# Patient Record
Sex: Female | Born: 1970 | Race: White | Hispanic: No | Marital: Married | Smoking: Current every day smoker
Health system: Southern US, Community
[De-identification: ages and names within clinical notes are randomized; demographics above are authoritative.]

## PROBLEM LIST (undated history)

## (undated) DIAGNOSIS — F419 Anxiety disorder, unspecified: Secondary | ICD-10-CM

## (undated) DIAGNOSIS — F329 Major depressive disorder, single episode, unspecified: Secondary | ICD-10-CM

## (undated) DIAGNOSIS — F988 Other specified behavioral and emotional disorders with onset usually occurring in childhood and adolescence: Secondary | ICD-10-CM

## (undated) DIAGNOSIS — M1990 Unspecified osteoarthritis, unspecified site: Secondary | ICD-10-CM

## (undated) DIAGNOSIS — I10 Essential (primary) hypertension: Secondary | ICD-10-CM

## (undated) DIAGNOSIS — R7303 Prediabetes: Secondary | ICD-10-CM

## (undated) DIAGNOSIS — F32A Depression, unspecified: Secondary | ICD-10-CM

## (undated) HISTORY — DX: Unspecified osteoarthritis, unspecified site: M19.90

---

## 2001-04-20 ENCOUNTER — Emergency Department (HOSPITAL_COMMUNITY): Admission: EM | Admit: 2001-04-20 | Discharge: 2001-04-21 | Payer: Self-pay | Admitting: *Deleted

## 2001-07-20 ENCOUNTER — Emergency Department (HOSPITAL_COMMUNITY): Admission: EM | Admit: 2001-07-20 | Discharge: 2001-07-20 | Payer: Self-pay | Admitting: Internal Medicine

## 2001-08-13 ENCOUNTER — Emergency Department (HOSPITAL_COMMUNITY): Admission: EM | Admit: 2001-08-13 | Discharge: 2001-08-13 | Payer: Self-pay | Admitting: Emergency Medicine

## 2001-11-02 ENCOUNTER — Emergency Department (HOSPITAL_COMMUNITY): Admission: EM | Admit: 2001-11-02 | Discharge: 2001-11-02 | Payer: Self-pay | Admitting: Emergency Medicine

## 2002-01-09 ENCOUNTER — Encounter: Payer: Self-pay | Admitting: Family Medicine

## 2002-01-09 ENCOUNTER — Ambulatory Visit (HOSPITAL_COMMUNITY): Admission: RE | Admit: 2002-01-09 | Discharge: 2002-01-09 | Payer: Self-pay | Admitting: Family Medicine

## 2002-06-14 ENCOUNTER — Emergency Department (HOSPITAL_COMMUNITY): Admission: EM | Admit: 2002-06-14 | Discharge: 2002-06-14 | Payer: Self-pay | Admitting: *Deleted

## 2002-06-28 ENCOUNTER — Emergency Department (HOSPITAL_COMMUNITY): Admission: EM | Admit: 2002-06-28 | Discharge: 2002-06-28 | Payer: Self-pay | Admitting: Emergency Medicine

## 2002-08-03 ENCOUNTER — Inpatient Hospital Stay (HOSPITAL_COMMUNITY): Admission: EM | Admit: 2002-08-03 | Discharge: 2002-08-07 | Payer: Self-pay | Admitting: Psychiatry

## 2002-08-03 ENCOUNTER — Emergency Department (HOSPITAL_COMMUNITY): Admission: EM | Admit: 2002-08-03 | Discharge: 2002-08-03 | Payer: Self-pay | Admitting: Emergency Medicine

## 2002-08-30 ENCOUNTER — Emergency Department (HOSPITAL_COMMUNITY): Admission: EM | Admit: 2002-08-30 | Discharge: 2002-08-30 | Payer: Self-pay | Admitting: Emergency Medicine

## 2002-08-30 ENCOUNTER — Encounter: Payer: Self-pay | Admitting: Emergency Medicine

## 2002-09-11 ENCOUNTER — Emergency Department (HOSPITAL_COMMUNITY): Admission: EM | Admit: 2002-09-11 | Discharge: 2002-09-11 | Payer: Self-pay | Admitting: Emergency Medicine

## 2002-10-06 ENCOUNTER — Emergency Department (HOSPITAL_COMMUNITY): Admission: EM | Admit: 2002-10-06 | Discharge: 2002-10-06 | Payer: Self-pay | Admitting: *Deleted

## 2002-10-06 ENCOUNTER — Encounter: Payer: Self-pay | Admitting: *Deleted

## 2003-03-27 ENCOUNTER — Emergency Department (HOSPITAL_COMMUNITY): Admission: EM | Admit: 2003-03-27 | Discharge: 2003-03-27 | Payer: Self-pay | Admitting: Emergency Medicine

## 2003-04-30 ENCOUNTER — Emergency Department (HOSPITAL_COMMUNITY): Admission: EM | Admit: 2003-04-30 | Discharge: 2003-04-30 | Payer: Self-pay | Admitting: Emergency Medicine

## 2003-12-12 ENCOUNTER — Emergency Department (HOSPITAL_COMMUNITY): Admission: EM | Admit: 2003-12-12 | Discharge: 2003-12-12 | Payer: Self-pay | Admitting: Emergency Medicine

## 2004-01-22 ENCOUNTER — Emergency Department (HOSPITAL_COMMUNITY): Admission: EM | Admit: 2004-01-22 | Discharge: 2004-01-22 | Payer: Self-pay | Admitting: Emergency Medicine

## 2004-04-24 ENCOUNTER — Emergency Department (HOSPITAL_COMMUNITY): Admission: EM | Admit: 2004-04-24 | Discharge: 2004-04-24 | Payer: Self-pay | Admitting: Emergency Medicine

## 2004-06-18 ENCOUNTER — Emergency Department (HOSPITAL_COMMUNITY): Admission: EM | Admit: 2004-06-18 | Discharge: 2004-06-18 | Payer: Self-pay | Admitting: Emergency Medicine

## 2004-07-08 ENCOUNTER — Emergency Department (HOSPITAL_COMMUNITY): Admission: EM | Admit: 2004-07-08 | Discharge: 2004-07-08 | Payer: Self-pay | Admitting: Emergency Medicine

## 2004-07-10 ENCOUNTER — Emergency Department (HOSPITAL_COMMUNITY): Admission: EM | Admit: 2004-07-10 | Discharge: 2004-07-10 | Payer: Self-pay | Admitting: Emergency Medicine

## 2004-09-11 ENCOUNTER — Emergency Department (HOSPITAL_COMMUNITY): Admission: EM | Admit: 2004-09-11 | Discharge: 2004-09-11 | Payer: Self-pay | Admitting: Emergency Medicine

## 2004-09-12 ENCOUNTER — Emergency Department (HOSPITAL_COMMUNITY): Admission: EM | Admit: 2004-09-12 | Discharge: 2004-09-12 | Payer: Self-pay | Admitting: Emergency Medicine

## 2004-10-19 ENCOUNTER — Emergency Department (HOSPITAL_COMMUNITY): Admission: EM | Admit: 2004-10-19 | Discharge: 2004-10-19 | Payer: Self-pay | Admitting: Emergency Medicine

## 2004-11-13 ENCOUNTER — Emergency Department (HOSPITAL_COMMUNITY): Admission: EM | Admit: 2004-11-13 | Discharge: 2004-11-13 | Payer: Self-pay | Admitting: Emergency Medicine

## 2005-04-19 ENCOUNTER — Emergency Department (HOSPITAL_COMMUNITY): Admission: EM | Admit: 2005-04-19 | Discharge: 2005-04-19 | Payer: Self-pay | Admitting: Emergency Medicine

## 2005-06-06 ENCOUNTER — Emergency Department (HOSPITAL_COMMUNITY): Admission: EM | Admit: 2005-06-06 | Discharge: 2005-06-06 | Payer: Self-pay | Admitting: Emergency Medicine

## 2005-06-20 ENCOUNTER — Emergency Department (HOSPITAL_COMMUNITY): Admission: EM | Admit: 2005-06-20 | Discharge: 2005-06-20 | Payer: Self-pay | Admitting: Emergency Medicine

## 2005-07-08 ENCOUNTER — Emergency Department (HOSPITAL_COMMUNITY): Admission: EM | Admit: 2005-07-08 | Discharge: 2005-07-08 | Payer: Self-pay | Admitting: Emergency Medicine

## 2005-07-22 ENCOUNTER — Emergency Department (HOSPITAL_COMMUNITY): Admission: EM | Admit: 2005-07-22 | Discharge: 2005-07-22 | Payer: Self-pay | Admitting: Emergency Medicine

## 2005-08-26 ENCOUNTER — Emergency Department (HOSPITAL_COMMUNITY): Admission: EM | Admit: 2005-08-26 | Discharge: 2005-08-26 | Payer: Self-pay | Admitting: Emergency Medicine

## 2005-09-16 ENCOUNTER — Emergency Department (HOSPITAL_COMMUNITY): Admission: EM | Admit: 2005-09-16 | Discharge: 2005-09-16 | Payer: Self-pay | Admitting: Emergency Medicine

## 2005-10-08 ENCOUNTER — Emergency Department (HOSPITAL_COMMUNITY): Admission: EM | Admit: 2005-10-08 | Discharge: 2005-10-08 | Payer: Self-pay | Admitting: Emergency Medicine

## 2005-12-30 ENCOUNTER — Emergency Department (HOSPITAL_COMMUNITY): Admission: EM | Admit: 2005-12-30 | Discharge: 2005-12-30 | Payer: Self-pay | Admitting: Emergency Medicine

## 2006-03-28 ENCOUNTER — Emergency Department (HOSPITAL_COMMUNITY): Admission: EM | Admit: 2006-03-28 | Discharge: 2006-03-28 | Payer: Self-pay | Admitting: Emergency Medicine

## 2007-07-03 ENCOUNTER — Emergency Department (HOSPITAL_COMMUNITY): Admission: EM | Admit: 2007-07-03 | Discharge: 2007-07-03 | Payer: Self-pay | Admitting: Emergency Medicine

## 2009-09-17 ENCOUNTER — Emergency Department (HOSPITAL_COMMUNITY): Admission: EM | Admit: 2009-09-17 | Discharge: 2009-09-17 | Payer: Self-pay | Admitting: Emergency Medicine

## 2010-04-17 ENCOUNTER — Emergency Department (HOSPITAL_COMMUNITY): Admission: EM | Admit: 2010-04-17 | Discharge: 2010-04-18 | Payer: Self-pay | Admitting: Emergency Medicine

## 2010-09-10 ENCOUNTER — Encounter: Payer: Self-pay | Admitting: Emergency Medicine

## 2010-11-02 LAB — BASIC METABOLIC PANEL
BUN: 7 mg/dL (ref 6–23)
CO2: 24 mEq/L (ref 19–32)
Chloride: 107 mEq/L (ref 96–112)
GFR calc Af Amer: >60 mL/min (ref >60)
GFR calc non Af Amer: >60 mL/min (ref >60)
Sodium: 140 mEq/L (ref 135–145)

## 2010-11-02 LAB — RAPID URINE DRUG SCREEN, HOSP PERFORMED
Amphetamines: NOT DETECTED
Benzodiazepines: POSITIVE — AB

## 2010-11-02 LAB — URINALYSIS, ROUTINE W REFLEX MICROSCOPIC
Bilirubin Urine: NEGATIVE
Glucose, UA: NEGATIVE mg/dL
Leukocytes, UA: NEGATIVE
Nitrite: NEGATIVE
pH: 5.5 (ref 5.0–8.0)

## 2010-11-02 LAB — CBC
HCT: 49 % — ABNORMAL HIGH (ref 36.0–46.0)
Hemoglobin: 16.1 g/dL — ABNORMAL HIGH (ref 12.0–15.0)
Platelets: 406 10*3/uL — ABNORMAL HIGH (ref 150–400)
RDW: 13 % (ref 11.5–15.5)

## 2010-11-02 LAB — ETHANOL: Alcohol, Ethyl (B): <5 mg/dL (ref 0–10)

## 2010-11-02 LAB — DIFFERENTIAL
Lymphs Abs: 4.5 10*3/uL — ABNORMAL HIGH (ref 0.7–4.0)
Monocytes Relative: 3 % (ref 3–12)
Neutrophils Relative %: 68 % (ref 43–77)

## 2010-11-02 LAB — URINE MICROSCOPIC-ADD ON

## 2010-11-05 LAB — BASIC METABOLIC PANEL WITH GFR
BUN: 9 mg/dL (ref 6–23)
CO2: 26 meq/L (ref 19–32)
Calcium: 9.9 mg/dL (ref 8.4–10.5)
Chloride: 102 meq/L (ref 96–112)
Creatinine, Ser: 0.59 mg/dL (ref 0.4–1.2)
GFR calc non Af Amer: >60 mL/min
Glucose, Bld: 109 mg/dL — ABNORMAL HIGH (ref 70–99)
Potassium: 3.2 meq/L — ABNORMAL LOW (ref 3.5–5.1)
Sodium: 136 meq/L (ref 135–145)

## 2010-11-05 LAB — RAPID URINE DRUG SCREEN, HOSP PERFORMED
Amphetamines: NOT DETECTED
Barbiturates: NOT DETECTED
Cocaine: NOT DETECTED
Opiates: NOT DETECTED
Tetrahydrocannabinol: NOT DETECTED

## 2010-11-05 LAB — CBC
HCT: 45.4 % (ref 36.0–46.0)
Hemoglobin: 15.4 g/dL — ABNORMAL HIGH (ref 12.0–15.0)
MCHC: 33.9 g/dL (ref 30.0–36.0)
MCV: 91.4 fL (ref 78.0–100.0)
Platelets: 405 10*3/uL — ABNORMAL HIGH (ref 150–400)
RBC: 4.97 MIL/uL (ref 3.87–5.11)
RDW: 13.3 % (ref 11.5–15.5)
WBC: 13.4 10*3/uL — ABNORMAL HIGH (ref 4.0–10.5)

## 2010-11-05 LAB — DIFFERENTIAL
Basophils Absolute: 0 10*3/uL (ref 0.0–0.1)
Basophils Relative: 0 % (ref 0–1)
Eosinophils Absolute: 0 10*3/uL (ref 0.0–0.7)
Eosinophils Relative: 0 % (ref 0–5)
Lymphocytes Relative: 22 % (ref 12–46)
Lymphs Abs: 3 10*3/uL (ref 0.7–4.0)
Monocytes Absolute: 0.7 10*3/uL (ref 0.1–1.0)
Monocytes Relative: 5 % (ref 3–12)
Neutro Abs: 9.7 10*3/uL — ABNORMAL HIGH (ref 1.7–7.7)
Neutrophils Relative %: 72 % (ref 43–77)

## 2010-11-05 LAB — ETHANOL: Alcohol, Ethyl (B): <5 mg/dL (ref 0–10)

## 2010-11-05 LAB — URINE MICROSCOPIC-ADD ON

## 2010-11-05 LAB — URINALYSIS, ROUTINE W REFLEX MICROSCOPIC
Leukocytes, UA: NEGATIVE
Nitrite: NEGATIVE

## 2011-01-05 NOTE — Discharge Summary (Signed)
NAMELEANI, Andrea Watts                        ACCOUNT NO.:  [id number]   MEDICAL RECORD NO.:  [id number]                   PATIENT TYPE:  IPS   LOCATION:  0504                                 FACILITY:  [facility]   PHYSICIAN:  Andrea Watts, M.D.                 DATE OF BIRTH:  05-01-1971   DATE OF ADMISSION:  08/03/2002  DATE OF DISCHARGE:  08/07/2002                                 DISCHARGE SUMMARY   INITIAL ASSESSMENT AND DIAGNOSIS:  [name] was admitted to the hospital  with complaints of depression.  She had been misusing pain medications to  make herself feel better.  She also had been having problems with  relationships with a former partner of hers.  She had been having suicidal  thoughts off and on and was thinking of either hanging herself or getting  involved in a motor vehicle accident.  She had been buying Percocet,  hydrocodone, and Vicodin off the streets, taking up to 15 pills per day.  Her last use was a week prior to admission.  She had been doing this off and  on for a couple of years.  She had missed some doses of her antidepressant.  At the time of admission her suicidal thoughts were vague.   MENTAL STATUS AT THE TIME OF THE INITIAL EVALUATION:  Revealed an alert,  oriented woman whose speech was clear, mood was depressed and anxious,  thoughts were logical and coherent.  There was no evidence of any psychosis.  She denied any suicidal or homicidal ideation, no evidence of any paranoia.  Judgment and insight were fair.  Memory was good.  Other pertinent history  can be obtained from the psychosocial service summary.   PHYSICAL EXAMINATION:  Essentially normal.   ADMITTING DIAGNOSIS:   AXIS I:  1. Major depressive disorder with suicidal ideation.  2. Opiate abuse.   AXIS II:  Deferred.   AXIS III:  No diagnosis.   AXIS IV:  Moderate.   AXIS V:  30/65.   FINDINGS:  All indicated laboratory examinations were within normal limits  or were  noncontributory.   HOSPITAL COURSE:  While in the hospital, [name] consistently denied any  suicidal thoughts.  She was basically cooperative with the program.  She did  a lot of thinking about relationships particularly.  She was currently  living with the woman she had the former relationship with and even though  there was no great pressure to resume the relationship, they remained  friends.  The relationship that had just ended was with somebody else and  she was dealing with that by talking about it.  [place] for many  years, but she says now that there are really no strings to hold her here it  was a good time to do it.  She felt good about that decision.  She also met  with her current housemate and they had a session just talking about the  issues they related to each other.  Her housemate was comfortable having her  home.  She was eager to go home.  She was consistently denying any thoughts  of hurting herself or anyone else and she was willing to follow up with  outpatient, and she was consequently discharged.   MEDICATIONS AT THE TIME OF DISCHARGE:  1. Alprazolam 1 mg twice a day as needed.  2. Paxil CR 25 mg 2 daily.  3. Clonidine 0.1 mg twice a day for 2 days, then once daily for 1 day, and     then to discontinue.   POST HOSPITAL CARE PLANS:  Followup will be with Dr. Lourdes Watts on September 03, 2002, and Dr. Kieth Watts with an appointment on December 23.   DIET AND ACTIVITY:  There were no restrictions placed on her diet or her  exercise.   FINAL DIAGNOSES:   AXIS I:  1. Depressive disorder not otherwise specified.  2. Opiate abuse.   AXIS II:  Deferred.   AXIS III:  No diagnosis.   AXIS IV:  Moderate.   AXIS V:  50/65.                                                 Andrea Watts, M.D.    GT/MEDQ  D:  08/17/2002  T:  08/17/2002  Job:  [id number]

## 2011-01-05 NOTE — H&P (Signed)
NAMEWINNONA, Andrea Watts                        ACCOUNT NO.:  [id number]   MEDICAL RECORD NO.:  [id number]                   PATIENT TYPE:  IPS   LOCATION:  0504                                 FACILITY:  [facility]   PHYSICIAN:  Jeanice Lim, M.D.              DATE OF BIRTH:  [birth year]   DATE OF ADMISSION:  08/03/2002  DATE OF DISCHARGE:                         PSYCHIATRIC ADMISSION ASSESSMENT   IDENTIFYING INFORMATION:  The patient is a 40 year old single white female  voluntarily admitted on August 03, 2002.   HISTORY OF PRESENT ILLNESS:  The patient presents with a history of  depression.  She has been abusing pain pills to make herself feel better.  She has been having relationship problems with a significant other.  She  states she is also having suicidal thoughts on and off to hang herself or  get involved in a motor vehicle accident.  She states that she feels like  she in a hole.  She has been buying Percocet, hydrocodone, and Vicodin off  the streets, taking up to 15 pills per day.  Her last use was one week ago.  She states she has been doing this for approximately two years.  The patient  also reports that she has missed a few doses of her antidepressant.  Currently, there is not any suicidal thoughts but having vague homicidal  thoughts toward this new relationship of a former friend.  She denies any  psychosis.  She states that her sleep and appetite have been satisfactory  and denies any withdrawal symptoms.   PAST PSYCHIATRIC HISTORY:  This is the first hospitalization to [facility].  She was at [facility] five years ago for a suicide attempt  where she overdosed.  She has no history of being detoxified.   SUBSTANCE ABUSE HISTORY:  The patient smokes.  She drinks alcohol on a very  rare occasion.  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  She also used crack cocaine about a year ago.   PAST MEDICAL HISTORY:  Primary care Daryon Remmert: Dr.  Marilu Favre and [facility] in  [place], [place].  Medical problems: None.   MEDICATIONS:  1. Paxil CR 25 mg; the patient has been on this for one year.  2. Xanax 1 mg; has been on that for about three years.   She states they were prescribed by Dr. Marilu Favre.   DRUG ALLERGIES:  No know allergies.   PHYSICAL EXAMINATION:  GENERAL:  Physical examination at [facility]  Emergency Department.  The patient appears well developed, well nourished,  in no acute distress.  VITAL SIGNS:  The patient is 207 pounds.  She is 5 feet 7 inches tall.  Temperature 97.4, heart rate 76, respirations 18, blood pressure 121/72.   LABORATORY DATA:  CBC: WBC count 12.9, potassium 3.4.  Urine drug screen is  positive for benzodiazepines, positive for opiates, and THC.   SOCIAL  HISTORY:  She is a 40 year old single white female with no children.  She lives alone.  She works in Designer, fashion/clothing.  She has no legal problems.  She  completed the 12th grade.   FAMILY HISTORY:  None.   MENTAL STATUS EXAM:  She is an alert, young adult, cooperative, with good  eye contact.  Speech is clear.  Mood is depressed and anxious.  Affect is  blunt.  Thought processes are coherent; no evidence of psychosis, no  auditory and visual hallucinations, no suicidal or homicidal ideation, no  paranoia.  Cognitive: Intact.  Memory is good.  Judgment and insight are  fair.   ADMISSION DIAGNOSES:   AXIS I:  1. Major depression with suicidal ideation.  2. Opiate abuse.   AXIS II:  Deferred.   AXIS III:  None.   AXIS IV:  Problems with primary support group, other psychosocial problems.   AXIS V:  Current is 30, this past year is 36.   INITIAL PLAN OF CARE:  Plan is a voluntary admission to [facility] for depression, suicidal ideation, and opiate abuse.  Contract for  safety, check every 15 minutes.  Will resume her Paxil.  Will increase her  dosage to decrease depressive symptoms.  Stabilize mood and thinking so the   patient can be safe.  Medication compliance was discussed.  Will increase  coping skills by the patient attending groups.  Will watch closely for signs  of withdrawal.  Will follow up with EAP and mental health.   ESTIMATED LENGTH OF STAY:  Three to five days.     Landry Corporal, N.P.                       Jeanice Lim, M.D.    JO/MEDQ  D:  08/04/2002  T:  08/04/2002  Job:  [id number]

## 2011-06-24 ENCOUNTER — Emergency Department (HOSPITAL_COMMUNITY)
Admission: EM | Admit: 2011-06-24 | Discharge: 2011-06-24 | Disposition: A | Discharge status: Home / Self Care | Payer: Self-pay | Attending: Emergency Medicine | Admitting: Emergency Medicine

## 2011-06-24 DIAGNOSIS — R51 Headache: Secondary | ICD-10-CM

## 2011-06-24 DIAGNOSIS — F411 Generalized anxiety disorder: Secondary | ICD-10-CM | POA: Insufficient documentation

## 2011-06-24 DIAGNOSIS — F3289 Other specified depressive episodes: Secondary | ICD-10-CM | POA: Insufficient documentation

## 2011-06-24 DIAGNOSIS — F329 Major depressive disorder, single episode, unspecified: Secondary | ICD-10-CM | POA: Insufficient documentation

## 2011-06-24 DIAGNOSIS — F17200 Nicotine dependence, unspecified, uncomplicated: Secondary | ICD-10-CM | POA: Insufficient documentation

## 2011-06-24 HISTORY — DX: Anxiety disorder, unspecified: F41.9

## 2011-06-24 HISTORY — DX: Major depressive disorder, single episode, unspecified: F32.9

## 2011-06-24 HISTORY — DX: Depression, unspecified: F32.A

## 2011-06-24 MED ORDER — SODIUM CHLORIDE 0.9 % IV SOLN
INTRAVENOUS | Status: DC
  Administered 2011-06-24: 17:00:00 via INTRAVENOUS

## 2011-06-24 MED ORDER — DEXAMETHASONE SODIUM PHOSPHATE 4 MG/ML IJ SOLN
10.0000 mg | Freq: Once | INTRAMUSCULAR | Status: DC
  Filled 2011-06-24: qty 1
  Filled 2011-06-24: qty 2

## 2011-06-24 MED ORDER — METOCLOPRAMIDE HCL 5 MG/ML IJ SOLN
10.0000 mg | Freq: Once | INTRAMUSCULAR | Status: AC
  Administered 2011-06-24: 10 mg via INTRAVENOUS
  Filled 2011-06-24: qty 2

## 2011-06-24 MED ORDER — PROMETHAZINE HCL 25 MG/ML IJ SOLN
25.0000 mg | Freq: Once | INTRAMUSCULAR | Status: AC
  Administered 2011-06-24: 25 mg via INTRAVENOUS
  Filled 2011-06-24: qty 1

## 2011-06-24 MED ORDER — SODIUM CHLORIDE 0.9 % IV BOLUS (SEPSIS)
1000.0000 mL | Freq: Once | INTRAVENOUS | Status: AC
  Administered 2011-06-24: 1000 mL via INTRAVENOUS

## 2011-06-24 MED ORDER — KETOROLAC TROMETHAMINE 30 MG/ML IJ SOLN
30.0000 mg | Freq: Once | INTRAMUSCULAR | Status: AC
  Administered 2011-06-24: 30 mg via INTRAVENOUS
  Filled 2011-06-24: qty 1

## 2011-06-24 MED ORDER — DIPHENHYDRAMINE HCL 50 MG/ML IJ SOLN
25.0000 mg | Freq: Once | INTRAMUSCULAR | Status: AC
  Administered 2011-06-24: 25 mg via INTRAVENOUS
  Filled 2011-06-24: qty 1

## 2011-06-24 MED ORDER — DEXAMETHASONE SODIUM PHOSPHATE 10 MG/ML IJ SOLN
INTRAMUSCULAR | Status: AC
  Administered 2011-06-24: 10 mg
  Filled 2011-06-24: qty 1

## 2011-06-24 NOTE — ED Notes (Signed)
EDP in prior to RN, see EDP assessment for further 

## 2011-06-24 NOTE — ED Provider Notes (Signed)
History    Scribed for Glynn Octave, MD, the patient was seen in room APA10/APA10. This chart was scribed by Katha Cabal.   CSN: [id number] Arrival date & time: 06/24/2011  2:54 PM   First MD Initiated Contact with Patient 06/24/11 1500      Chief Complaint  Patient presents with  . Migraine    (Consider location/radiation/quality/duration/timing/severity/associated sxs/prior treatment) HPI Andrea Watts is a 40 y.o. female who presents to the Emergency Department complaining of gradual onset of moderate to severe migraine headache.  Patient reports that pain radiates down neck.  Patient reports sinus headache since about a week ago.  Headache worsened yesterday.  Last episode was over a year ago.  Pain is constant and is described as throbbing and pressured.  Pain is aggravated by light and sound.  Patient has taken Advil, Tylenol and Toradol for pain.  Pain is not associated with fever, nausea, vomiting, neck stiffness, blurred vision or back pain.    PCP   Dr. Clelia Croft in [place]    Past Medical History  Diagnosis Date  . Depression   . Anxiety     History reviewed. No pertinent past surgical history.  No family history on file.  History  Substance Use Topics  . Smoking status: Current Everyday Smoker -- 0.5 packs/day  . Smokeless tobacco: Not on file  . Alcohol Use: No    OB History    Grav Para Term Preterm Abortions TAB SAB Ect Mult Living                  Review of Systems  All other systems reviewed and are negative.     Allergies  Review of patient's allergies indicates no known allergies.  Home Medications   Current Outpatient Rx  Name Route Sig Dispense Refill  . DIAZEPAM 10 MG PO TABS Oral Take 10 mg by mouth daily.      Marland Kitchen FLUOXETINE HCL 40 MG PO CAPS Oral Take 40 mg by mouth daily.        BP 126/89  Pulse 96  Temp(Src) 98.1 F (36.7 C) (Oral)  Resp 16  Ht 5\' 7"  (1.702 m)  Wt 210 lb (95.255 kg)  BMI 32.89 kg/m2  SpO2 99%  LMP  06/21/2011  Physical Exam  Nursing note and vitals reviewed. Constitutional: She is oriented to person, place, and time. She appears well-developed and well-nourished. No distress.  HENT:  Head: Normocephalic and atraumatic.  Eyes: Conjunctivae and EOM are normal.  Fundoscopic exam:      The right eye shows no papilledema.       The left eye shows no papilledema.       Pupils 7mm dilated but equal and reactive   Neck: Normal range of motion. Neck supple.       No meningismus  Cardiovascular: Normal rate and regular rhythm.   Pulmonary/Chest: Effort normal and breath sounds normal. No respiratory distress.  Abdominal: Soft. There is no tenderness.  Musculoskeletal: Normal range of motion.  Neurological: She is alert and oriented to person, place, and time. She has normal strength. No cranial nerve deficit or sensory deficit. Coordination normal.  Skin: Skin is warm, dry and intact.  Psychiatric: She has a normal mood and affect. Her behavior is normal.    ED Course  Procedures (including critical care time)   DIAGNOSTIC STUDIES: Oxygen Saturation is 99% on room air, normal by my interpretation.    COORDINATION OF CARE:  3:19 PM  Physical exam  complete.  Pain Control.  5:06 PM  Recheck.  Patient sleeping upon walking into the room.  Patient reports improvement in nausea.  Pain controlled.  Plan to discharge patient.  Patient agrees with plan.      MEDICATIONS GIVEN IN THE E.D. Scheduled Meds:    . dexamethasone      . dexamethasone  10 mg Intravenous Once  . diphenhydrAMINE  25 mg Intravenous Once  . ketorolac  30 mg Intravenous Once  . metoCLOPramide (REGLAN) injection  10 mg Intravenous Once  . promethazine  25 mg Intravenous Once  . sodium chloride  1,000 mL Intravenous Once   Continuous Infusions:    . sodium chloride 125 mL/hr at 06/24/11 1645     LABS / RADIOLOGY:   Labs Reviewed - No data to display No results found.       MDM  Gradual onset  headache similar to previous associated with photophobia, phonophobia. Denies any nausea, vomiting, fever, neck stiffness. Take Advil and Tylenol at home without relief. No neurological deficits, no meningismus, no appreciable papilledema. We'll treat with hydration, antiemetics and Toradol.  Recheck at 4:23 PM. Patient states she is feeling better though the headache is still lingering in her forehead. Nausea and photophobia improved. We'll dose additional medications to try to alleviate headache.       IMPRESSION: 1. Headache             I personally performed the services described in this documentation, which was scribed in my presence.  The recorded information has been reviewed and considered.   Glynn Octave, MD 06/24/11 [phone]

## 2011-06-24 NOTE — ED Notes (Signed)
Pt presents with migraine x 2 days. Pt states she is light sensitive. Pt denies n/v. Pt states she took advil, tylenol, BC powders and toradol today with no relief. Pt ambulated to triage with steady gate.

## 2011-06-24 NOTE — ED Notes (Signed)
Pt states that her pain is better but still continues to have pain ,

## 2011-06-28 ENCOUNTER — Emergency Department (HOSPITAL_COMMUNITY)
Admission: EM | Admit: 2011-06-28 | Discharge: 2011-06-28 | Disposition: A | Discharge status: Home / Self Care | Payer: Self-pay | Attending: Emergency Medicine | Admitting: Emergency Medicine

## 2011-06-28 ENCOUNTER — Encounter (HOSPITAL_COMMUNITY): Payer: Self-pay

## 2011-06-28 DIAGNOSIS — R110 Nausea: Secondary | ICD-10-CM | POA: Insufficient documentation

## 2011-06-28 DIAGNOSIS — F17200 Nicotine dependence, unspecified, uncomplicated: Secondary | ICD-10-CM | POA: Insufficient documentation

## 2011-06-28 DIAGNOSIS — R51 Headache: Secondary | ICD-10-CM | POA: Insufficient documentation

## 2011-06-28 DIAGNOSIS — F329 Major depressive disorder, single episode, unspecified: Secondary | ICD-10-CM | POA: Insufficient documentation

## 2011-06-28 DIAGNOSIS — F3289 Other specified depressive episodes: Secondary | ICD-10-CM | POA: Insufficient documentation

## 2011-06-28 DIAGNOSIS — F411 Generalized anxiety disorder: Secondary | ICD-10-CM | POA: Insufficient documentation

## 2011-06-28 MED ORDER — BUTALBITAL-ASA-CAFF-CODEINE 50-325-40-30 MG PO CAPS: ORAL_CAPSULE | ORAL | Status: DC

## 2011-06-28 MED ORDER — FENTANYL CITRATE 0.05 MG/ML IJ SOLN
100.0000 ug | Freq: Once | INTRAMUSCULAR | Status: AC
  Administered 2011-06-28: 100 ug via INTRAMUSCULAR
  Filled 2011-06-28: qty 2

## 2011-06-28 MED ORDER — PROMETHAZINE HCL 12.5 MG PO TABS
25.0000 mg | ORAL_TABLET | Freq: Once | ORAL | Status: AC
  Administered 2011-06-28: 25 mg via ORAL
  Filled 2011-06-28: qty 2

## 2011-06-28 NOTE — ED Notes (Signed)
Pt presents with migraine.Pt states she has been seen by PMD and no medications that she has been started on have helped. Pt states she is having light and sound sensitivity. Pt states pain feels like someone is pressing on temples.

## 2011-06-28 NOTE — ED Notes (Signed)
Pt states migraine started last Friday.  Was seen by PCP, given Rx for Imitrex without relief.  Pt reports feels like her head is in a vise at the temporal region. Pt denies n/v or other illness.

## 2011-06-28 NOTE — ED Provider Notes (Signed)
History     CSN: [id number] Arrival date & time: 06/28/2011  8:42 PM   First MD Initiated Contact with Patient 06/28/11 2120      Chief Complaint  Patient presents with  . Migraine    (Consider location/radiation/quality/duration/timing/severity/associated sxs/prior treatment) Patient is a 40 y.o. female presenting with migraine. The history is provided by the patient.  Migraine This is a recurrent problem. The current episode started in the past 7 days. The problem has been gradually worsening. Associated symptoms include headaches and nausea. Pertinent negatives include no abdominal pain, arthralgias, chest pain, coughing or neck pain. The symptoms are aggravated by nothing. She has tried acetaminophen, NSAIDs and rest for the symptoms. The treatment provided no relief.    Past Medical History  Diagnosis Date  . Depression   . Anxiety     History reviewed. No pertinent past surgical history.  No family history on file.  History  Substance Use Topics  . Smoking status: Current Everyday Smoker -- 0.5 packs/day  . Smokeless tobacco: Not on file  . Alcohol Use: No    OB History    Grav Para Term Preterm Abortions TAB SAB Ect Mult Living                  Review of Systems  Constitutional: Negative for activity change.       All ROS Neg except as noted in HPI  HENT: Negative for nosebleeds and neck pain.   Eyes: Negative for photophobia and discharge.  Respiratory: Negative for cough, shortness of breath and wheezing.   Cardiovascular: Negative for chest pain and palpitations.  Gastrointestinal: Positive for nausea. Negative for abdominal pain and blood in stool.  Genitourinary: Negative for dysuria, frequency and hematuria.  Musculoskeletal: Negative for back pain and arthralgias.  Skin: Negative.   Neurological: Positive for headaches. Negative for dizziness, seizures and speech difficulty.  Psychiatric/Behavioral: Negative for hallucinations and confusion.     Allergies  Review of patient's allergies indicates no known allergies.  Home Medications   Current Outpatient Rx  Name Route Sig Dispense Refill  . AMITRIPTYLINE HCL 10 MG PO TABS Oral Take 10 mg by mouth at bedtime.      Marland Kitchen DIAZEPAM 10 MG PO TABS Oral Take 10 mg by mouth daily.      Marland Kitchen FLUOXETINE HCL 40 MG PO CAPS Oral Take 40 mg by mouth daily.      . IBUPROFEN 200 MG PO TABS Oral Take 800 mg by mouth as needed. For migraine     . SUMATRIPTAN SUCCINATE 100 MG PO TABS Oral Take 100 mg by mouth as needed. For migraine     . BUTALBITAL-ASA-CAFF-CODEINE 50-325-40-30 MG PO CAPS  1 or 2 po q6h prn headache. Take with food 24 capsule 0    BP 125/73  Pulse 95  Temp(Src) 97.6 F (36.4 C) (Oral)  Resp 18  Ht 5\' 7"  (1.702 m)  Wt 210 lb (95.255 kg)  BMI 32.89 kg/m2  SpO2 99%  LMP 06/21/2011  Physical Exam  Nursing note and vitals reviewed. Constitutional: She is oriented to person, place, and time. She appears well-developed and well-nourished.  Non-toxic appearance.  HENT:  Head: Normocephalic.  Right Ear: Tympanic membrane and external ear normal.  Left Ear: Tympanic membrane and external ear normal.  Eyes: EOM and lids are normal. Pupils are equal, round, and reactive to light.  Neck: Normal range of motion. Neck supple. Carotid bruit is not present.  Cardiovascular: Normal rate, regular rhythm,  normal heart sounds, intact distal pulses and normal pulses.   Pulmonary/Chest: Breath sounds normal. No respiratory distress.  Abdominal: Soft. Bowel sounds are normal. There is no tenderness. There is no guarding.  Musculoskeletal: Normal range of motion.  Lymphadenopathy:       Head (right side): No submandibular adenopathy present.       Head (left side): No submandibular adenopathy present.    She has no cervical adenopathy.  Neurological: She is alert and oriented to person, place, and time. She has normal strength. No cranial nerve deficit or sensory deficit. She exhibits normal  muscle tone. Coordination and gait normal. GCS eye subscore is 4. GCS verbal subscore is 5. GCS motor subscore is 6.  Skin: Skin is warm and dry.  Psychiatric: She has a normal mood and affect. Her speech is normal.    ED Course  Procedures (including critical care time)  Labs Reviewed - No data to display No results found.   1. Headache       MDM  I have reviewed nursing notes, vital signs, and all appropriate lab and imaging results for this patient. Pt feeling much better after medication. No neuro changes. Safe for pt to be discharged.        Kathie Dike, [place] 06/28/11 2340

## 2011-06-29 NOTE — ED Provider Notes (Signed)
Medical screening examination/treatment/procedure(s) were performed by non-physician practitioner and as supervising physician I was immediately available for consultation/collaboration.  Bradley Bostelman L Loreta Blouch, MD 06/29/11 0013 

## 2011-07-31 ENCOUNTER — Emergency Department (HOSPITAL_COMMUNITY)
Admission: EM | Admit: 2011-07-31 | Discharge: 2011-07-31 | Disposition: A | Discharge status: Home / Self Care | Payer: Self-pay | Attending: Emergency Medicine | Admitting: Emergency Medicine

## 2011-07-31 ENCOUNTER — Encounter (HOSPITAL_COMMUNITY): Payer: Self-pay | Admitting: *Deleted

## 2011-07-31 DIAGNOSIS — F3289 Other specified depressive episodes: Secondary | ICD-10-CM | POA: Insufficient documentation

## 2011-07-31 DIAGNOSIS — F329 Major depressive disorder, single episode, unspecified: Secondary | ICD-10-CM | POA: Insufficient documentation

## 2011-07-31 DIAGNOSIS — R51 Headache: Secondary | ICD-10-CM | POA: Insufficient documentation

## 2011-07-31 DIAGNOSIS — F411 Generalized anxiety disorder: Secondary | ICD-10-CM | POA: Insufficient documentation

## 2011-07-31 DIAGNOSIS — F17200 Nicotine dependence, unspecified, uncomplicated: Secondary | ICD-10-CM | POA: Insufficient documentation

## 2011-07-31 MED ORDER — MORPHINE SULFATE 2 MG/ML IJ SOLN
2.0000 mg | Freq: Once | INTRAMUSCULAR | Status: AC
  Administered 2011-07-31: 2 mg via INTRAVENOUS
  Filled 2011-07-31: qty 1

## 2011-07-31 MED ORDER — METOCLOPRAMIDE HCL 5 MG/ML IJ SOLN
10.0000 mg | Freq: Once | INTRAMUSCULAR | Status: AC
  Administered 2011-07-31: 10 mg via INTRAVENOUS
  Filled 2011-07-31: qty 2

## 2011-07-31 MED ORDER — SODIUM CHLORIDE 0.9 % IV BOLUS (SEPSIS)
1000.0000 mL | Freq: Once | INTRAVENOUS | Status: AC
  Administered 2011-07-31: 1000 mL via INTRAVENOUS

## 2011-07-31 MED ORDER — KETOROLAC TROMETHAMINE 30 MG/ML IJ SOLN
15.0000 mg | Freq: Once | INTRAMUSCULAR | Status: AC
  Administered 2011-07-31: 15 mg via INTRAVENOUS
  Filled 2011-07-31: qty 1

## 2011-07-31 MED ORDER — BUTALBITAL-ASA-CAFF-CODEINE 50-325-40-30 MG PO CAPS: 1.0000 | ORAL_CAPSULE | Freq: Four times a day (QID) | ORAL | Status: AC | PRN

## 2011-07-31 MED ORDER — DIPHENHYDRAMINE HCL 50 MG/ML IJ SOLN
25.0000 mg | Freq: Once | INTRAMUSCULAR | Status: AC
  Administered 2011-07-31: 25 mg via INTRAVENOUS
  Filled 2011-07-31: qty 1

## 2011-07-31 NOTE — ED Notes (Signed)
Reports migraine headache.  States was seen for previous same a couple weeks ago.  Reports PCP will not treat, recommended to see neurologist.  Unable to be seen till January.  Reports this headache began yesterday.

## 2011-07-31 NOTE — ED Provider Notes (Signed)
History   This chart was scribed for Raeford Razor, MD by Clarita Crane. The patient was seen in room APA01/APA01 and the patient's care was started at 9:15PM.   CSN: [id number] Arrival date & time: 07/31/2011  9:02 PM   First MD Initiated Contact with Patient 07/31/11 2110      Chief Complaint  Patient presents with  . Migraine    (Consider location/radiation/quality/duration/timing/severity/associated sxs/prior treatment) HPI Andrea Watts is a 40 y.o. female who presents to the Emergency Department complaining of waxing and waning moderate to severe diffuse HA described as pressure and similar to previous migraines onset seven weeks ago but worse yesterday and persistent since with associated neck stiffness, photophobia, mild nausea, fatigue. Notes HA is aggravated by light and relieved by nothing. Denies back pain, blurred vision, vomiting, numbness, tingling, weakness, rhinorrhea. Notes she has been evaluated in ED previously for same complaint and was prescribed Fioricet which provided mild to moderate relief.  Past Medical History  Diagnosis Date  . Depression   . Anxiety     History reviewed. No pertinent past surgical history.  No family history on file.  History  Substance Use Topics  . Smoking status: Current Everyday Smoker -- 0.5 packs/day  . Smokeless tobacco: Not on file  . Alcohol Use: No    OB History    Grav Para Term Preterm Abortions TAB SAB Ect Mult Living                  Review of Systems 10 Systems reviewed and are negative for acute change except as noted in the HPI.  Allergies  Review of patient's allergies indicates no known allergies.  Home Medications   Current Outpatient Rx  Name Route Sig Dispense Refill  . TOPIRAMATE 25 MG PO TABS Oral Take 25 mg by mouth daily.      Marland Kitchen AMITRIPTYLINE HCL 10 MG PO TABS Oral Take 10 mg by mouth at bedtime.      Marland Kitchen BUTALBITAL-ASA-CAFF-CODEINE 50-325-40-30 MG PO CAPS  1 or 2 po q6h prn headache. Take  with food 24 capsule 0  . DIAZEPAM 10 MG PO TABS Oral Take 10 mg by mouth daily.      Marland Kitchen FLUOXETINE HCL 40 MG PO CAPS Oral Take 40 mg by mouth daily.      . IBUPROFEN 200 MG PO TABS Oral Take 800 mg by mouth as needed. For migraine     . SUMATRIPTAN SUCCINATE 100 MG PO TABS Oral Take 100 mg by mouth as needed. For migraine       BP 122/63  Pulse 93  Temp 97.3 F (36.3 C)  Resp 20  Ht 5\' 7"  (1.702 m)  Wt 220 lb (99.791 kg)  BMI 34.46 kg/m2  SpO2 99%  LMP 07/17/2011  Physical Exam  Nursing note and vitals reviewed. Constitutional: She is oriented to person, place, and time. She appears well-developed and well-nourished. No distress.  HENT:  Head: Normocephalic and atraumatic.       Frontal sinus tenderness.   Eyes: EOM are normal. Pupils are equal, round, and reactive to light.  Neck: Normal range of motion. Neck supple. No tracheal deviation present.       No signs of meningismus.   Cardiovascular: Normal rate and regular rhythm.  Exam reveals no gallop and no friction rub.   No murmur heard. Pulmonary/Chest: Effort normal. No respiratory distress. She has no wheezes.  Abdominal: Soft. She exhibits no distension.  Musculoskeletal: Normal range of motion.  She exhibits no edema.  Neurological: She is alert and oriented to person, place, and time. No cranial nerve deficit or sensory deficit.       Bilateral upper and lower extremity strength normal and equal bilaterally. Normal finger to nose test.   Skin: Skin is warm and dry.  Psychiatric: She has a normal mood and affect. Her behavior is normal.    ED Course  Procedures (including critical care time)  DIAGNOSTIC STUDIES: Oxygen Saturation is 99% on room air, normal by my interpretation.    COORDINATION OF CARE: 10:56PM- Patient resting comfortable upon entry into room. States she is feeling better at this time following administration of IVFs, Reglan, Benadryl and Toradol via IV. Notes she is still experiencing pain despite  feeling better though.   Labs Reviewed - No data to display No results found.   1. Headache       MDM  40yF with HA. Suspect primary HA. Secondary causes considered but clinically doubt. No hx of trauma. No fever or signs of meningismus. Nonfocal neuro exam. Denies use of blood thinning medication or hypercoaguable state. Denies use of exogenous estrogen. Doubt bleed or venous thrombosis.  No contacts with similar symptoms to suggest CO poisoning. Doubt temporal arteritis given age and lack of temporal tenderness. Doubt glaucoma. Consider mass but with nonfocal neuro exam do not feel neuro imaging indicated at this time. Pt resting comfortably prior to DC. Says HA improved but still feels a little. Script for fiorinal/codeine since says seems to help her the most. Has PCP to follow-up with. Says already has plans to see HA clinic.     I personally preformed the services scribed in my presence. The recorded information has been reviewed and considered. Raeford Razor, MD.    Raeford Razor, MD 07/31/11 [phone]

## 2017-07-23 ENCOUNTER — Encounter (HOSPITAL_COMMUNITY): Payer: Self-pay | Admitting: *Deleted

## 2017-07-23 ENCOUNTER — Other Ambulatory Visit: Payer: Self-pay

## 2017-07-23 ENCOUNTER — Emergency Department (HOSPITAL_COMMUNITY): Payer: Self-pay

## 2017-07-23 ENCOUNTER — Inpatient Hospital Stay (HOSPITAL_COMMUNITY)
Admission: EM | Admit: 2017-07-23 | Discharge: 2017-07-27 | DRG: 601 | Disposition: A | Discharge status: Home / Self Care | Payer: Self-pay | Attending: Internal Medicine | Admitting: Internal Medicine

## 2017-07-23 DIAGNOSIS — L98499 Non-pressure chronic ulcer of skin of other sites with unspecified severity: Secondary | ICD-10-CM | POA: Diagnosis present

## 2017-07-23 DIAGNOSIS — B9689 Other specified bacterial agents as the cause of diseases classified elsewhere: Secondary | ICD-10-CM | POA: Diagnosis present

## 2017-07-23 DIAGNOSIS — Z79899 Other long term (current) drug therapy: Secondary | ICD-10-CM

## 2017-07-23 DIAGNOSIS — Z6837 Body mass index (BMI) 37.0-37.9, adult: Secondary | ICD-10-CM

## 2017-07-23 DIAGNOSIS — N611 Abscess of the breast and nipple: Secondary | ICD-10-CM | POA: Diagnosis present

## 2017-07-23 DIAGNOSIS — E669 Obesity, unspecified: Secondary | ICD-10-CM | POA: Diagnosis present

## 2017-07-23 DIAGNOSIS — N63 Unspecified lump in unspecified breast: Secondary | ICD-10-CM | POA: Diagnosis present

## 2017-07-23 DIAGNOSIS — F17210 Nicotine dependence, cigarettes, uncomplicated: Secondary | ICD-10-CM | POA: Diagnosis present

## 2017-07-23 DIAGNOSIS — T63331A Toxic effect of venom of brown recluse spider, accidental (unintentional), initial encounter: Secondary | ICD-10-CM | POA: Diagnosis present

## 2017-07-23 DIAGNOSIS — F419 Anxiety disorder, unspecified: Secondary | ICD-10-CM | POA: Diagnosis present

## 2017-07-23 DIAGNOSIS — R600 Localized edema: Secondary | ICD-10-CM | POA: Diagnosis present

## 2017-07-23 DIAGNOSIS — F609 Personality disorder, unspecified: Secondary | ICD-10-CM | POA: Diagnosis present

## 2017-07-23 DIAGNOSIS — Z720 Tobacco use: Secondary | ICD-10-CM | POA: Diagnosis present

## 2017-07-23 DIAGNOSIS — F32A Depression, unspecified: Secondary | ICD-10-CM | POA: Diagnosis present

## 2017-07-23 DIAGNOSIS — F988 Other specified behavioral and emotional disorders with onset usually occurring in childhood and adolescence: Secondary | ICD-10-CM | POA: Diagnosis present

## 2017-07-23 DIAGNOSIS — F329 Major depressive disorder, single episode, unspecified: Secondary | ICD-10-CM | POA: Diagnosis present

## 2017-07-23 DIAGNOSIS — N61 Mastitis without abscess: Principal | ICD-10-CM | POA: Diagnosis present

## 2017-07-23 HISTORY — DX: Other specified behavioral and emotional disorders with onset usually occurring in childhood and adolescence: F98.8

## 2017-07-23 LAB — CBC WITH DIFFERENTIAL/PLATELET
BASOS PCT: 0 %
Basophils Absolute: 0 10*3/uL (ref 0.0–0.1)
EOS ABS: 0.2 10*3/uL (ref 0.0–0.7)
EOS PCT: 1 %
HCT: 43.8 % (ref 36.0–46.0)
Hemoglobin: 14.4 g/dL (ref 12.0–15.0)
Lymphocytes Relative: 28 %
Lymphs Abs: 4.1 10*3/uL — ABNORMAL HIGH (ref 0.7–4.0)
MCH: 30.8 pg (ref 26.0–34.0)
MCHC: 32.9 g/dL (ref 30.0–36.0)
MCV: 93.6 fL (ref 78.0–100.0)
Monocytes Absolute: 0.7 10*3/uL (ref 0.1–1.0)
Monocytes Relative: 5 %
NEUTROS PCT: 66 %
Neutro Abs: 9.8 10*3/uL — ABNORMAL HIGH (ref 1.7–7.7)
PLATELETS: 389 10*3/uL (ref 150–400)
RBC: 4.68 MIL/uL (ref 3.87–5.11)
RDW: 12.7 % (ref 11.5–15.5)
WBC: 14.9 10*3/uL — AB (ref 4.0–10.5)

## 2017-07-23 LAB — BASIC METABOLIC PANEL
Anion gap: 9 (ref 5–15)
BUN: 10 mg/dL (ref 6–20)
CALCIUM: 9.3 mg/dL (ref 8.9–10.3)
CO2: 27 mmol/L (ref 22–32)
CREATININE: 0.65 mg/dL (ref 0.44–1.00)
Chloride: 100 mmol/L — ABNORMAL LOW (ref 101–111)
Glucose, Bld: 124 mg/dL — ABNORMAL HIGH (ref 65–99)
Potassium: 3.7 mmol/L (ref 3.5–5.1)
SODIUM: 136 mmol/L (ref 135–145)

## 2017-07-23 LAB — LACTIC ACID, PLASMA: LACTIC ACID, VENOUS: 1 mmol/L (ref 0.5–1.9)

## 2017-07-23 MED ORDER — MORPHINE SULFATE (PF) 4 MG/ML IV SOLN
4.0000 mg | Freq: Once | INTRAVENOUS | Status: AC
  Administered 2017-07-23: 4 mg via INTRAVENOUS
  Filled 2017-07-23: qty 1

## 2017-07-23 MED ORDER — ONDANSETRON HCL 4 MG/2ML IJ SOLN
4.0000 mg | Freq: Once | INTRAMUSCULAR | Status: AC
  Administered 2017-07-23: 4 mg via INTRAVENOUS
  Filled 2017-07-23: qty 2

## 2017-07-23 MED ORDER — CEFAZOLIN SODIUM-DEXTROSE 1-4 GM/50ML-% IV SOLN
1.0000 g | Freq: Once | INTRAVENOUS | Status: AC
  Administered 2017-07-23: 1 g via INTRAVENOUS
  Filled 2017-07-23: qty 50

## 2017-07-23 MED ORDER — IOPAMIDOL (ISOVUE-300) INJECTION 61%
75.0000 mL | Freq: Once | INTRAVENOUS | Status: AC | PRN
  Administered 2017-07-23: 75 mL via INTRAVENOUS

## 2017-07-23 NOTE — ED Provider Notes (Signed)
Planes of painful right breast onset 2 days ago.  Seen at another emergency department 2 days ago, started on doxycycline, without relief.  Denies fever.  Last tetanus immunization less than 10 years ago.  On exam alert nontoxic right breast with an open wound approximately 2 cm in diameter draining beige pus.  Breast is reddened laterally to the axilla, with corresponding tenderness   Doug SouJacubowitz, Cleston Lautner, MD 07/23/17 2221

## 2017-07-23 NOTE — ED Provider Notes (Signed)
[facility] EMERGENCY DEPARTMENT Provider Note   CSN: [id number] Arrival date & time: 07/23/17  1918     History   Chief Complaint Chief Complaint  Patient presents with  . Wound Check    HPI Andrea Watts is a 46 y.o. female.  Patient is a 46 year old female who presents to the emergency department with a complaint of wound of the right breast.  Patient states that on Sunday, December 2 she noticed a bump on her breasts.  It continued to bother her and continue to get larger so [facility] went [facility] to [facility] it assessed.  It was their opinion that the patient may have sustained a bite from a brown recluse spider.  The patient was given an injection of antibiotics, and then placed on doxycycline.  The patient states that within 24-48 hours the area began to have drainage.  She now has increased redness under the breast and extending the length of the breast.  She has extreme pain in the area.  She has been using the doxycycline and naproxen, but states that neither these are helping at all.  She denies any measured temperature elevation, but states that she has had some chills on December 2.  There is been no nausea vomiting.  She says she is felt tired and is been sleeping a lot which is different for her.  The patient denies any diabetes, or any other medical conditions that would interfere with her immune system.  She presents now because she feels like she is getting worse and would like to seek additional treatment and management.      Past Medical History:  Diagnosis Date  . Anxiety   . Depression     There are no active problems to display for this patient.   History reviewed. No pertinent surgical history.  OB History    No data available       Home Medications    Prior to Admission medications   Medication Sig Start Date End Date Taking? Authorizing Provider  ALPRAZolam Prudy Feeler(XANAX) 1 MG tablet Take 1 mg by mouth 2 (two) times daily as needed for anxiety.    Yes [provider]  amphetamine-dextroamphetamine (ADDERALL) 20 MG tablet Take 20 mg by mouth 3 (three) times daily. 07/09/17  Yes [provider]  citalopram (CELEXA) 40 MG tablet Take 40 mg by mouth daily.   Yes [provider]  doxycycline (VIBRA-TABS) 100 MG tablet Take 100 mg by mouth 2 (two) times daily. 10 day course starting on 07/22/2017   Yes [provider]  naproxen sodium (ALEVE) 220 MG tablet Take 660 mg by mouth daily as needed (for pain).   Yes [provider]    Family History History reviewed. No pertinent family history.  Social History Social History   Tobacco Use  . Smoking status: Current Every Day Smoker    Packs/day: 0.50  . Smokeless tobacco: Never Used  Substance Use Topics  . Alcohol use: No  . Drug use: No     Allergies   Patient has no known allergies.   Review of Systems Review of Systems  Constitutional: Positive for activity change and chills.       All ROS Neg except as noted in HPI  HENT: Negative for nosebleeds.   Eyes: Negative for photophobia and discharge.  Respiratory: Negative for cough, shortness of breath and wheezing.   Cardiovascular: Negative for chest pain and palpitations.  Gastrointestinal: Negative for abdominal pain and blood  in stool.  Genitourinary: Negative for dysuria, frequency and hematuria.       Breast pain  Musculoskeletal: Negative for arthralgias, back pain and neck pain.  Skin: Positive for wound.  Neurological: Negative for dizziness, seizures and speech difficulty.  Psychiatric/Behavioral: Negative for confusion and hallucinations.     Physical Exam Updated Vital Signs BP (!) 141/94   Pulse 100   Temp 97.6 F (36.4 C)   Resp 18   Ht 5\' 7"  (1.702 m)   Wt 108.9 kg (240 lb)   LMP 07/07/2017   SpO2 97%   BMI 37.59 kg/m   Physical Exam  Constitutional: She is oriented to person, place, and time. She appears well-developed and well-nourished.  Non-toxic  appearance.  HENT:  Head: Normocephalic.  Right Ear: Tympanic membrane and external ear normal.  Left Ear: Tympanic membrane and external ear normal.  Eyes: EOM and lids are normal. Pupils are equal, round, and reactive to light.  Neck: Normal range of motion. Neck supple. Carotid bruit is not present.  Cardiovascular: Regular rhythm, normal heart sounds, intact distal pulses and normal pulses. Tachycardia present.  Pulmonary/Chest: Breath sounds normal. No respiratory distress.  There is a quarter size open round wound under the right breast.  There is increased white area with one small black area present.  There is drainage with red and cream color pus from this lesion.  There is increased fluctuance.  There is redness from the edge of the  to the tail of the breast extending nearly to the axilla.  The area is warm to touch but not hot.  The entire area of redness shows a great deal of pain to palpation or movement.  There are no palpable nodes in the axilla.  Abdominal: Soft. Bowel sounds are normal. There is no tenderness. There is no guarding.  Musculoskeletal: Normal range of motion.  Trace edema.  Lymphadenopathy:       Head (right side): No submandibular adenopathy present.       Head (left side): No submandibular adenopathy present.    She has no cervical adenopathy.  Neurological: She is alert and oriented to person, place, and time. She has normal strength. No cranial nerve deficit or sensory deficit.  Skin: Skin is warm and dry.  Psychiatric: She has a normal mood and affect. Her speech is normal.  Nursing note and vitals reviewed.    ED Treatments / Results  Labs (all labs ordered are listed, but only abnormal results are displayed) Labs Reviewed  BASIC METABOLIC PANEL - Abnormal; Notable for the following components:      Result Value   Chloride 100 (*)    Glucose, Bld 124 (*)    All other components within normal limits  CBC WITH DIFFERENTIAL/PLATELET - Abnormal;  Notable for the following components:   WBC 14.9 (*)    Neutro Abs 9.8 (*)    Lymphs Abs 4.1 (*)    All other components within normal limits  AEROBIC CULTURE (SUPERFICIAL SPECIMEN)  LACTIC ACID, PLASMA  LACTIC ACID, PLASMA    EKG  EKG Interpretation None       Radiology No results found.  Procedures Procedures (including critical care time)  Medications Ordered in ED Medications  ceFAZolin (ANCEF) IVPB 1 g/50 mL premix (not administered)     Initial Impression / Assessment and Plan / ED Course  I have reviewed the triage vital signs and the nursing notes.  Pertinent labs & imaging results that were available during my care  of the patient were reviewed by me and considered in my medical decision making (see chart for details).       Final Clinical Impressions(s) / ED Diagnoses MDM Patient states she has a lesion of the right breast that started out as a small bump, and now has redness that involves a major portion of the right breast.  She was told at another facility that she may have a brown recluse spider bite.  She presents now for assistance with this issue, as it seems to be getting worse with doxycycline.  Lactic acid is normal at 1.  Basic metabolic panel is nonacute.  Anion gap is 9.  Complete blood count shows a white blood cells to be elevated at 14,900.  No noted shift to the left. Blood cultures pending.  Patient started on IV cephalosporin and IV vancomycin.  Case discussed with Dr. Jilda RocheBridges-surgery.  CT scan ordered.  The CT scan shows diffuse lateral breast skin edema.  There is a 10 mm module in the right breast upper outer quadrant present.  There is also enlarged reactive lymph nodes, but no noted abscess.  The patient will be admitted by the hospitalist.  Dr. Henreitta LeberBridges will consult.  Patient is in agreement with this plan.   Final diagnoses:  Cellulitis of right breast    ED Discharge Orders    None       Ivery QualeBryant, Narcissa Melder, PA-C 07/24/17  0034    Doug SouJacubowitz, Sam, MD 07/24/17 [phone]

## 2017-07-23 NOTE — ED Triage Notes (Signed)
Pt woke up on Sunday morning and felt a sharp pain in her right breast. Pt noticed a red bump. Pt went to [facility] and was given Doxycycline for a brown recluse spider bite and cellulitis. Pt now has a quarter sized whitish area in the center with redness, swelling and warmth to it.

## 2017-07-24 ENCOUNTER — Encounter (HOSPITAL_COMMUNITY): Payer: Self-pay | Admitting: Internal Medicine

## 2017-07-24 DIAGNOSIS — F419 Anxiety disorder, unspecified: Secondary | ICD-10-CM | POA: Diagnosis present

## 2017-07-24 DIAGNOSIS — Z720 Tobacco use: Secondary | ICD-10-CM | POA: Diagnosis present

## 2017-07-24 DIAGNOSIS — F988 Other specified behavioral and emotional disorders with onset usually occurring in childhood and adolescence: Secondary | ICD-10-CM | POA: Diagnosis present

## 2017-07-24 DIAGNOSIS — T63331A Toxic effect of venom of brown recluse spider, accidental (unintentional), initial encounter: Secondary | ICD-10-CM

## 2017-07-24 DIAGNOSIS — N63 Unspecified lump in unspecified breast: Secondary | ICD-10-CM | POA: Diagnosis present

## 2017-07-24 DIAGNOSIS — F609 Personality disorder, unspecified: Secondary | ICD-10-CM | POA: Diagnosis present

## 2017-07-24 DIAGNOSIS — N61 Mastitis without abscess: Principal | ICD-10-CM

## 2017-07-24 DIAGNOSIS — F329 Major depressive disorder, single episode, unspecified: Secondary | ICD-10-CM | POA: Diagnosis present

## 2017-07-24 DIAGNOSIS — F32A Depression, unspecified: Secondary | ICD-10-CM | POA: Diagnosis present

## 2017-07-24 LAB — BASIC METABOLIC PANEL
Anion gap: 6 (ref 5–15)
BUN: 11 mg/dL (ref 6–20)
CHLORIDE: 102 mmol/L (ref 101–111)
CO2: 28 mmol/L (ref 22–32)
Calcium: 9 mg/dL (ref 8.9–10.3)
Creatinine, Ser: 0.52 mg/dL (ref 0.44–1.00)
GFR calc Af Amer: >60 mL/min (ref >60)
GFR calc non Af Amer: >60 mL/min (ref >60)
GLUCOSE: 102 mg/dL — AB (ref 65–99)
POTASSIUM: 3.7 mmol/L (ref 3.5–5.1)
Sodium: 136 mmol/L (ref 135–145)

## 2017-07-24 LAB — CBC WITH DIFFERENTIAL/PLATELET
Basophils Absolute: 0 10*3/uL (ref 0.0–0.1)
Basophils Relative: 0 %
Eosinophils Absolute: 0.2 10*3/uL (ref 0.0–0.7)
Eosinophils Relative: 2 %
HCT: 42.8 % (ref 36.0–46.0)
HEMOGLOBIN: 13.7 g/dL (ref 12.0–15.0)
LYMPHS ABS: 4.1 10*3/uL — AB (ref 0.7–4.0)
LYMPHS PCT: 33 %
MCH: 30.2 pg (ref 26.0–34.0)
MCHC: 32 g/dL (ref 30.0–36.0)
MCV: 94.3 fL (ref 78.0–100.0)
MONOS PCT: 7 %
Monocytes Absolute: 0.9 10*3/uL (ref 0.1–1.0)
NEUTROS PCT: 58 %
Neutro Abs: 7.1 10*3/uL (ref 1.7–7.7)
Platelets: 362 10*3/uL (ref 150–400)
RBC: 4.54 MIL/uL (ref 3.87–5.11)
RDW: 12.7 % (ref 11.5–15.5)
WBC: 12.3 10*3/uL — AB (ref 4.0–10.5)

## 2017-07-24 LAB — PROTIME-INR
INR: 1.04
PROTHROMBIN TIME: 13.5 s (ref 11.4–15.2)

## 2017-07-24 MED ORDER — PIPERACILLIN-TAZOBACTAM 3.375 G IVPB
3.3750 g | Freq: Once | INTRAVENOUS | Status: AC
  Administered 2017-07-24: 3.375 g via INTRAVENOUS
  Filled 2017-07-24: qty 50

## 2017-07-24 MED ORDER — KETOROLAC TROMETHAMINE 30 MG/ML IJ SOLN: 30.0000 mg | Freq: Four times a day (QID) | INTRAMUSCULAR | Status: AC | PRN

## 2017-07-24 MED ORDER — SODIUM CHLORIDE 0.9 % IV SOLN
INTRAVENOUS | Status: DC
  Administered 2017-07-24 – 2017-07-25 (×2): via INTRAVENOUS

## 2017-07-24 MED ORDER — KETOROLAC TROMETHAMINE 30 MG/ML IJ SOLN
INTRAMUSCULAR | Status: AC
  Filled 2017-07-24: qty 1

## 2017-07-24 MED ORDER — ONDANSETRON HCL 4 MG PO TABS: 4.0000 mg | ORAL_TABLET | Freq: Four times a day (QID) | ORAL | Status: DC | PRN

## 2017-07-24 MED ORDER — ALPRAZOLAM 1 MG PO TABS: 1.0000 mg | ORAL_TABLET | Freq: Two times a day (BID) | ORAL | Status: DC | PRN

## 2017-07-24 MED ORDER — PIPERACILLIN-TAZOBACTAM 3.375 G IVPB
3.3750 g | Freq: Three times a day (TID) | INTRAVENOUS | Status: DC
  Administered 2017-07-24: 3.375 g via INTRAVENOUS
  Filled 2017-07-24: qty 50

## 2017-07-24 MED ORDER — VANCOMYCIN HCL IN DEXTROSE 1-5 GM/200ML-% IV SOLN
1000.0000 mg | INTRAVENOUS | Status: AC
  Administered 2017-07-24 (×2): 1000 mg via INTRAVENOUS
  Filled 2017-07-24 (×2): qty 200

## 2017-07-24 MED ORDER — NAPROXEN SODIUM 220 MG PO TABS: 660.0000 mg | ORAL_TABLET | Freq: Every day | ORAL | Status: DC | PRN

## 2017-07-24 MED ORDER — KETOROLAC TROMETHAMINE 30 MG/ML IJ SOLN
30.0000 mg | Freq: Four times a day (QID) | INTRAMUSCULAR | Status: AC
  Administered 2017-07-24 (×2): 30 mg via INTRAVENOUS
  Filled 2017-07-24 (×2): qty 1

## 2017-07-24 MED ORDER — OXYCODONE HCL 5 MG PO TABS
5.0000 mg | ORAL_TABLET | ORAL | Status: DC | PRN
  Administered 2017-07-24 – 2017-07-27 (×11): 5 mg via ORAL
  Filled 2017-07-24 (×11): qty 1

## 2017-07-24 MED ORDER — ONDANSETRON HCL 4 MG/2ML IJ SOLN: 4.0000 mg | Freq: Four times a day (QID) | INTRAMUSCULAR | Status: DC | PRN

## 2017-07-24 MED ORDER — HEPARIN SODIUM (PORCINE) 5000 UNIT/ML IJ SOLN
5000.0000 [IU] | Freq: Three times a day (TID) | INTRAMUSCULAR | Status: DC
  Administered 2017-07-24 – 2017-07-27 (×7): 5000 [IU] via SUBCUTANEOUS
  Filled 2017-07-24 (×7): qty 1

## 2017-07-24 MED ORDER — VANCOMYCIN HCL IN DEXTROSE 1-5 GM/200ML-% IV SOLN
1000.0000 mg | Freq: Three times a day (TID) | INTRAVENOUS | Status: DC
  Administered 2017-07-24 – 2017-07-27 (×9): 1000 mg via INTRAVENOUS
  Filled 2017-07-24 (×7): qty 200

## 2017-07-24 MED ORDER — COLLAGENASE 250 UNIT/GM EX OINT
TOPICAL_OINTMENT | Freq: Two times a day (BID) | CUTANEOUS | Status: DC
  Administered 2017-07-24 – 2017-07-27 (×7): via TOPICAL
  Filled 2017-07-24: qty 30

## 2017-07-24 MED ORDER — AMPHETAMINE-DEXTROAMPHETAMINE 10 MG PO TABS
20.0000 mg | ORAL_TABLET | Freq: Three times a day (TID) | ORAL | Status: DC
  Administered 2017-07-24 – 2017-07-27 (×10): 20 mg via ORAL
  Filled 2017-07-24 (×10): qty 2

## 2017-07-24 MED ORDER — PIPERACILLIN-TAZOBACTAM 3.375 G IVPB 30 MIN
3.3750 g | Freq: Once | INTRAVENOUS | Status: DC
  Filled 2017-07-24: qty 50

## 2017-07-24 MED ORDER — VANCOMYCIN HCL 10 G IV SOLR
2000.0000 mg | Freq: Once | INTRAVENOUS | Status: DC
  Filled 2017-07-24: qty 2000

## 2017-07-24 MED ORDER — NICOTINE 21 MG/24HR TD PT24: 21.0000 mg | MEDICATED_PATCH | Freq: Every day | TRANSDERMAL | Status: DC | PRN

## 2017-07-24 MED ORDER — CITALOPRAM HYDROBROMIDE 20 MG PO TABS
40.0000 mg | ORAL_TABLET | Freq: Every day | ORAL | Status: DC
  Administered 2017-07-24 – 2017-07-27 (×4): 40 mg via ORAL
  Filled 2017-07-24 (×4): qty 2

## 2017-07-24 MED ORDER — KETOROLAC TROMETHAMINE 30 MG/ML IJ SOLN
30.0000 mg | Freq: Once | INTRAMUSCULAR | Status: AC
  Administered 2017-07-24: 30 mg via INTRAVENOUS

## 2017-07-24 NOTE — H&P (Signed)
4        History and Physical    Andrea Watts ZOX:[id number] DOB: [birth year] DOA: 07/23/2017  PCP: Kirstie PeriShah, Ashish, MD   Patient coming from: Home.  I have personally briefly reviewed [facility]  Chief Complaint: Right wrist infection.  HPI: Andrea AmorMelissa P Epp is a 46 y.o. female with medical history significant of anxiety, depression, adult attention deficit disorder who is coming to the emergency department with complaints of progressively worse right breast infection since Sunday.  [facility] and was given doxycycline for presumptive brown recluse spider bite and cellulitis.  However, since then she has developed a quarter size ulcer with extensive surrounding erythema, edema and calor to the area.  She has had some chills, but denies fever or night sweats.  No headaches, sore throat, dyspnea, cough, wheezing, chest pain, palpitations, dizziness, diaphoresis, lower extremity pitting edema, abdominal pain, nausea, emesis, diarrhea, constipation, melena or hematochezia.  She denies dysuria, frequency or hematuria.  She is not a diabetic and denies polyuria, polydipsia, polyphagia or blurred vision.  ED Course: Initial vital signs in the emergency department were temperature 36.4C, pulse 100, respirations 18, blood pressure 141/94 mmHg and O2 sat 97% on room air.  She received morphine 4 mg IVP x1, Zofran 4 mg IVP x1 and cefazolin 1 gram IVPB. I added toradol 30 mg IVPx1.  Her workup shows WBC of 14.9 with 66% neutrophils, hemoglobin 14.4 g/dL and platelets [id number].  Her BMP shows a chloride of 100 mmol/L and a nonfasting blood glucose of 124 mg/dL.  All other values are normal.  Lactic acid was 1.0 mmol/L.  Blood cultures and wound cultures were taken.  Imaging: CT chest with contrast showed diffuse right lateral breast skin edema. A 10 mm nodule in the right breast upper outer quadrant, posterior depth. Mildly enlarged, possibly reactive lymph  nodes in the right axilla. These findings could be further evaluate with non emergent mammography and focused right breast ultrasound. Otherwise no evidence of acute abnormality within the thorax.  Review of Systems: As per HPI otherwise 10 point review of systems negative.    Past Medical History:  Diagnosis Date  . Anxiety   . Depression     History reviewed. No pertinent surgical history.   reports that she has been smoking.  She has been smoking about 0.50 packs per day. she has never used smokeless tobacco. She reports that she does not drink alcohol or use drugs.  No Known Allergies  Family History  Problem Relation Age of Onset  . Lung cancer Father        Exposure to asbestos. Mesothelioma?  Marland Kitchen. Alcoholism Father     Prior to Admission medications   Medication Sig Start Date End Date Taking? Authorizing Provider  ALPRAZolam Prudy Feeler(XANAX) 1 MG tablet Take 1 mg by mouth 2 (two) times daily as needed for anxiety.   Yes [provider]  amphetamine-dextroamphetamine (ADDERALL) 20 MG tablet Take 20 mg by mouth 3 (three) times daily. 07/09/17  Yes [provider]  citalopram (CELEXA) 40 MG tablet Take 40 mg by mouth daily.   Yes [provider]  doxycycline (VIBRA-TABS) 100 MG tablet Take 100 mg by mouth 2 (two) times daily. 10 day course starting on 07/22/2017   Yes [provider]  naproxen sodium (ALEVE) 220 MG tablet Take 660 mg by mouth daily as needed (for pain).   Yes [provider]  Physical Exam: Vitals:   07/23/17 1930 07/23/17 2220 07/24/17 0112  BP: (!) 141/94 130/73 128/84  Pulse: 100 98 78  Resp: 18 16 18   Temp: 97.6 F (36.4 C) 97.6 F (36.4 C) 98.7 F (37.1 C)  TempSrc:  Oral Oral  SpO2: 97% 98% 99%  Weight: 108.9 kg (240 lb)    Height: 5\' 7"  (1.702 m)      Constitutional: NAD, calm, comfortable Eyes: PERRL, lids and conjunctivae normal ENMT: Mucous membranes are moist. Posterior pharynx clear of any exudate  or lesions. Neck: normal, supple, no masses, no thyromegaly Respiratory: clear to auscultation bilaterally, no wheezing, no crackles. Normal respiratory effort. No accessory muscle use.  Cardiovascular: Regular rate and rhythm, no murmurs / rubs / gallops. No extremity edema. 2+ pedal pulses. No carotid bruits.  Abdomen: Soft, no tenderness, no masses palpated. No hepatosplenomegaly. Bowel sounds positive.  Musculoskeletal: no clubbing / cyanosis. No joint deformity upper and lower extremities. Good ROM, no contractures. Normal muscle tone.  Skin: Right breast shows a 2-2.5 cm in diameter whitish ulcer with seropurulent discharge which is tender to palpation and shows increased fluctuance.  There is surrounding erythema, edema with calor of the area which extends to her lateral chest wall and axillary area.  Please see below pictures for further detail. Neurologic: CN 2-12 grossly intact. Sensation intact, DTR normal. Strength 5/5 in all 4.  Psychiatric: Normal judgment and insight. Alert and oriented x 3. Normal mood.       Breast examination and pictures taken with the patient's consent and in the presence of a female chaperone (Ms. Waynetta Sandy).  Labs on Admission: I have personally reviewed following labs and imaging studies  CBC: Recent Labs  Lab 07/23/17 2107  WBC 14.9*  NEUTROABS 9.8*  HGB 14.4  HCT 43.8  MCV 93.6  PLT 389   Basic Metabolic Panel: Recent Labs  Lab 07/23/17 2107  NA 136  K 3.7  CL 100*  CO2 27  GLUCOSE 124*  BUN 10  CREATININE 0.65  CALCIUM 9.3   GFR: Estimated Creatinine Clearance: 112.9 mL/min (by C-G formula based on SCr of 0.65 mg/dL). Liver Function Tests: No results for input(s): AST, ALT, ALKPHOS, BILITOT, PROT, ALBUMIN in the last 168 hours. No results for input(s): LIPASE, AMYLASE in the last 168 hours. No results for input(s): AMMONIA in the last 168 hours. Coagulation Profile: No results for input(s): INR, PROTIME in the last 168  hours. Cardiac Enzymes: No results for input(s): CKTOTAL, CKMB, CKMBINDEX, TROPONINI in the last 168 hours. BNP (last 3 results) No results for input(s): PROBNP in the last 8760 hours. HbA1C: No results for input(s): HGBA1C in the last 72 hours. CBG: No results for input(s): GLUCAP in the last 168 hours. Lipid Profile: No results for input(s): CHOL, HDL, LDLCALC, TRIG, CHOLHDL, LDLDIRECT in the last 72 hours. Thyroid Function Tests: No results for input(s): TSH, T4TOTAL, FREET4, T3FREE, THYROIDAB in the last 72 hours. Anemia Panel: No results for input(s): VITAMINB12, FOLATE, FERRITIN, TIBC, IRON, RETICCTPCT in the last 72 hours. Urine analysis:    Component Value Date/Time   COLORURINE YELLOW 04/17/2010 1930   APPEARANCEUR CLEAR 04/17/2010 1930   LABSPEC 1.010 04/17/2010 1930   PHURINE 5.5 04/17/2010 1930   GLUCOSEU NEGATIVE 04/17/2010 1930   HGBUR MODERATE (A) 04/17/2010 1930   BILIRUBINUR NEGATIVE 04/17/2010 1930   KETONESUR NEGATIVE 04/17/2010 1930   PROTEINUR NEGATIVE 04/17/2010 1930   UROBILINOGEN 0.2 04/17/2010 1930   NITRITE NEGATIVE 04/17/2010 1930   LEUKOCYTESUR  NEGATIVE 04/17/2010 1930    Radiological Exams on Admission: Ct Chest W Contrast  Result Date: 07/23/2017 CLINICAL DATA:  Painful right breast. EXAM: CT CHEST WITH CONTRAST TECHNIQUE: Multidetector CT imaging of the chest was performed during intravenous contrast administration. CONTRAST:  75mL ISOVUE-300 IOPAMIDOL (ISOVUE-300) INJECTION 61% COMPARISON:  Chest radiograph 04/17/2010 FINDINGS: Cardiovascular: No significant vascular findings. Normal heart size. No pericardial effusion. Mediastinum/Nodes: No enlarged mediastinal, hilar, or axillary lymph nodes. Thyroid gland, trachea, and esophagus demonstrate no significant findings. Lungs/Pleura: Lungs are clear. No pleural effusion or pneumothorax. Upper Abdomen: Marked hepatic steatosis. Otherwise no acute findings. Musculoskeletal: Diffuse right lateral breast  skin edema. 10 mm nodule in the right breast upper outer quadrant, posterior depth, with uncertain significance. Mildly enlarged, possibly reactive lymph nodes in the right axilla. No acute or significant osseous findings. IMPRESSION: Diffuse right lateral breast skin edema. 10 mm nodule in the right breast upper outer quadrant, posterior depth. Mildly enlarged, possibly reactive lymph nodes in the right axilla. These findings could be further evaluate with nonemergent mammography and focused right breast ultrasound. Otherwise no evidence of acute abnormality within the thorax. Electronically Signed   By: Ted Mcalpineobrinka  Dimitrova M.D.   On: 07/23/2017 23:47    EKG: Independently reviewed.  Assessment/Plan Principal Problem:   Cellulitis of right breast Admit to MedSurg/inpatient. Normal saline at 100 mL/h. Morphine 4 mg IVP every 3 hours as needed. Zofran 4 mg IVP every 6 hours as needed with morphine. Toradol 30 mg IVP every 6 hours x4 doses, then every 6 hours as needed. Vancomycin and Zosyn per pharmacy. Follow-up blood and wound cultures. General surgery will evaluate later today.  Active Problems:   Depression Continue Celexa 40 mg p.o. daily.    Anxiety On Celexa. Continue alprazolam 1 mg p.o. twice daily as needed.    ADD (attention deficit disorder) Continue Adderall 20 mg p.o. 3 times daily.    Tobacco use Nicotine replacement therapy ordered. Staff to provide tobacco cessation information.    Breast nodule Discussed with the patient. She will need non-emergent mammogram and right breast US. She will follow up with her primary provider.    DVT prophylaxis: Heparin SQ. Code Status: Full code. Family Communication:  Disposition Plan: Admit for IV antibiotics for 2-3 days. Consults called: Dr. Henreitta LeberBridges was contacted by the ED. Will evaluate in later today. Admission status: Inpatient/MedSurg.   Bobette Moavid Manuel Ortiz MD [facility] Pager [phone].  If 7PM-7AM,  please contact night-coverage [email or web address] Password [email or web address]  07/24/2017, 1:43 AM   This document was prepared using Dragon voice recognition software and may contain some unintended errors.

## 2017-07-24 NOTE — Consult Note (Addendum)
[facility] Consult  Reason for Consult:Right breast spider bite with infection  Referring Physician: Lafonda Mosses PA and Dr. Laverle Patter   Chief Complaint    Wound Check      Andrea Watts is a 46 y.o. female.  HPI: Andrea Watts is a 46 yo relatively healthy female who reports that she thinks she was bitten by a brown recluse on Sunday and was seen at the ED at [facility] where is was given doxycycline and discharged home. She reports that the pain and swelling and redness have gotten worse and there was a lot of drainage.  The patient does not report any bullae that popped, but says there was drainage.  She says that her last mammogram was 2 years ago and was normal.  She believes that the area where the ulceration is currently has not increased in size in a few days but is not completely sure because she does not want to look at the area.  Past Medical History:  Diagnosis Date  . ADD (attention deficit disorder)   . Anxiety   . Depression     History reviewed. No pertinent surgical history.  Family History  Problem Relation Age of Onset  . Lung cancer Father        Exposure to asbestos. Mesothelioma?  Marland Kitchen Alcoholism Father     Social History   Tobacco Use  . Smoking status: Current Every Day Smoker    Packs/day: 0.50  . Smokeless tobacco: Never Used  Substance Use Topics  . Alcohol use: No  . Drug use: No    Medications:  I have reviewed the patient's current medications. Prior to Admission:  Medications Prior to Admission  Medication Sig Dispense Refill Last Dose  . ALPRAZolam (XANAX) 1 MG tablet Take 1 mg by mouth 2 (two) times daily as needed for anxiety.   Past Week at Unknown time  . amphetamine-dextroamphetamine (ADDERALL) 20 MG tablet Take 20 mg by mouth 3 (three) times daily.  0 Past Week at Unknown time  . citalopram (CELEXA) 40 MG tablet Take 40 mg by mouth daily.   Past Week at Unknown time  . doxycycline (VIBRA-TABS) 100 MG tablet Take  100 mg by mouth 2 (two) times daily. 10 day course starting on 07/22/2017   07/23/2017 at Unknown time  . naproxen sodium (ALEVE) 220 MG tablet Take 660 mg by mouth daily as needed (for pain).   07/23/2017 at Unknown time   Scheduled: . amphetamine-dextroamphetamine  20 mg Oral TID  . citalopram  40 mg Oral Daily  . collagenase   Topical BID  . heparin  5,000 Units Subcutaneous Q8H  . ketorolac  30 mg Intravenous Q6H   Continuous: . sodium chloride    . piperacillin-tazobactam (ZOSYN)  IV    . vancomycin 1,000 mg (07/24/17 0848)  . vancomycin     ZOX:[id number], [START ON 07/25/2017] ketorolac, nicotine, ondansetron **OR** ondansetron (ZOFRAN) IV, oxyCODONE  Allergies: No Known Allergies  ROS:  A comprehensive review of systems was negative except for: Constitutional: positive for chills and decreased activity Musculoskeletal: positive for right breast pain and lateral to the breast with swelling and induration   Blood pressure 109/74, pulse 77, temperature (!) 97.1 F (36.2 C), temperature source Oral, resp. rate 18, height 5' 7" (1.702 m), weight 240 lb (108.9 kg), last menstrual period 07/07/2017, SpO2 98 %. Physical Exam  Constitutional: She is oriented to person, place, and time and well-developed, well-nourished, and in no distress.  HENT:  Head: Normocephalic.  Eyes: Pupils are equal, round, and reactive to light.  Neck: Normal range of motion.  Cardiovascular: Normal rate and regular rhythm.  Pulmonary/Chest: Effort normal and breath sounds normal.  Right breast swelling and tenderness laterally, retracting from the marking of the cellulitis, right lateral breast with 2.5cm ulcerated wound with fibrinous sloughing and a central area of necrosis; no masses or nipple drainage on right, no adenopathy palpable on axilla  Abdominal: Soft. She exhibits no distension. There is no tenderness.  Musculoskeletal: Normal range of motion.  Neurological: She is alert and oriented to  person, place, and time.  Skin: Skin is warm and dry.  Psychiatric: Mood, memory, affect and judgment normal.  Vitals reviewed.   Results: Results for orders placed or performed during the hospital encounter of 07/23/17 (from the past 48 hour(s))  Basic metabolic panel     Status: Abnormal   Collection Time: 07/23/17  9:07 PM  Result Value Ref Range   Sodium 136 135 - 145 mmol/L   Potassium 3.7 3.5 - 5.1 mmol/L   Chloride 100 (L) 101 - 111 mmol/L   CO2 27 22 - 32 mmol/L   Glucose, Bld 124 (H) 65 - 99 mg/dL   BUN 10 6 - 20 mg/dL   Creatinine, Ser 0.65 0.44 - 1.00 mg/dL   Calcium 9.3 8.9 - 10.3 mg/dL   GFR calc non Af Amer >60 >60 mL/min   GFR calc Af Amer >60 >60 mL/min    Comment: (NOTE) The eGFR has been calculated using the CKD EPI equation. This calculation has not been validated in all clinical situations. eGFR's persistently <60 mL/min signify possible Chronic Kidney Disease.    Anion gap 9 5 - 15  CBC with Differential     Status: Abnormal   Collection Time: 07/23/17  9:07 PM  Result Value Ref Range   WBC 14.9 (H) 4.0 - 10.5 K/uL   RBC 4.68 3.87 - 5.11 MIL/uL   Hemoglobin 14.4 12.0 - 15.0 g/dL   HCT 43.8 36.0 - 46.0 %   MCV 93.6 78.0 - 100.0 fL   MCH 30.8 26.0 - 34.0 pg   MCHC 32.9 30.0 - 36.0 g/dL   RDW 12.7 11.5 - 15.5 %   Platelets 389 150 - 400 K/uL   Neutrophils Relative % 66 %   Neutro Abs 9.8 (H) 1.7 - 7.7 K/uL   Lymphocytes Relative 28 %   Lymphs Abs 4.1 (H) 0.7 - 4.0 K/uL   Monocytes Relative 5 %   Monocytes Absolute 0.7 0.1 - 1.0 K/uL   Eosinophils Relative 1 %   Eosinophils Absolute 0.2 0.0 - 0.7 K/uL   Basophils Relative 0 %   Basophils Absolute 0.0 0.0 - 0.1 K/uL  Lactic acid, plasma     Status: None   Collection Time: 07/23/17 10:43 PM  Result Value Ref Range   Lactic Acid, Venous 1.0 0.5 - 1.9 mmol/L  Blood culture (routine x 2)     Status: None (Preliminary result)   Collection Time: 07/23/17 10:43 PM  Result Value Ref Range   Specimen  Description BLOOD RIGHT HAND    Special Requests      BOTTLES DRAWN AEROBIC AND ANAEROBIC Blood Culture results may not be optimal due to an excessive volume of blood received in culture bottles   Culture NO GROWTH < 12 HOURS    Report Status PENDING   Blood culture (routine x 2)     Status: None (Preliminary result)  Collection Time: 07/23/17 10:49 PM  Result Value Ref Range   Specimen Description BLOOD LEFT HAND    Special Requests      BOTTLES DRAWN AEROBIC AND ANAEROBIC Blood Culture adequate volume   Culture NO GROWTH < 12 HOURS    Report Status PENDING   CBC WITH DIFFERENTIAL     Status: Abnormal   Collection Time: 07/24/17  4:10 AM  Result Value Ref Range   WBC 12.3 (H) 4.0 - 10.5 K/uL   RBC 4.54 3.87 - 5.11 MIL/uL   Hemoglobin 13.7 12.0 - 15.0 g/dL   HCT 42.8 36.0 - 46.0 %   MCV 94.3 78.0 - 100.0 fL   MCH 30.2 26.0 - 34.0 pg   MCHC 32.0 30.0 - 36.0 g/dL   RDW 12.7 11.5 - 15.5 %   Platelets 362 150 - 400 K/uL   Neutrophils Relative % 58 %   Neutro Abs 7.1 1.7 - 7.7 K/uL   Lymphocytes Relative 33 %   Lymphs Abs 4.1 (H) 0.7 - 4.0 K/uL   Monocytes Relative 7 %   Monocytes Absolute 0.9 0.1 - 1.0 K/uL   Eosinophils Relative 2 %   Eosinophils Absolute 0.2 0.0 - 0.7 K/uL   Basophils Relative 0 %   Basophils Absolute 0.0 0.0 - 0.1 K/uL  Basic metabolic panel     Status: Abnormal   Collection Time: 07/24/17  4:10 AM  Result Value Ref Range   Sodium 136 135 - 145 mmol/L   Potassium 3.7 3.5 - 5.1 mmol/L   Chloride 102 101 - 111 mmol/L   CO2 28 22 - 32 mmol/L   Glucose, Bld 102 (H) 65 - 99 mg/dL   BUN 11 6 - 20 mg/dL   Creatinine, Ser 0.52 0.44 - 1.00 mg/dL   Calcium 9.0 8.9 - 10.3 mg/dL   GFR calc non Af Amer >60 >60 mL/min   GFR calc Af Amer >60 >60 mL/min    Comment: (NOTE) The eGFR has been calculated using the CKD EPI equation. This calculation has not been validated in all clinical situations. eGFR's persistently <60 mL/min signify possible Chronic  Kidney Disease.    Anion gap 6 5 - 15  Protime-INR     Status: None   Collection Time: 07/24/17  4:10 AM  Result Value Ref Range   Prothrombin Time 13.5 11.4 - 15.2 seconds   INR 1.04     Personally reviewed CT and no obvious area of fluid / abscess, edematous right lateral breast into the axilla, some adenopathy   Ct Chest W Contrast  Result Date: 07/23/2017 CLINICAL DATA:  Painful right breast. EXAM: CT CHEST WITH CONTRAST TECHNIQUE: Multidetector CT imaging of the chest was performed during intravenous contrast administration. CONTRAST:  76m ISOVUE-300 IOPAMIDOL (ISOVUE-300) INJECTION 61% COMPARISON:  Chest radiograph 04/17/2010 FINDINGS: Cardiovascular: No significant vascular findings. Normal heart size. No pericardial effusion. Mediastinum/Nodes: No enlarged mediastinal, hilar, or axillary lymph nodes. Thyroid gland, trachea, and esophagus demonstrate no significant findings. Lungs/Pleura: Lungs are clear. No pleural effusion or pneumothorax. Upper Abdomen: Marked hepatic steatosis. Otherwise no acute findings. Musculoskeletal: Diffuse right lateral breast skin edema. 10 mm nodule in the right breast upper outer quadrant, posterior depth, with uncertain significance. Mildly enlarged, possibly reactive lymph nodes in the right axilla. No acute or significant osseous findings. IMPRESSION: Diffuse right lateral breast skin edema. 10 mm nodule in the right breast upper outer quadrant, posterior depth. Mildly enlarged, possibly reactive lymph nodes in the right axilla. These findings could  be further evaluate with nonemergent mammography and focused right breast ultrasound. Otherwise no evidence of acute abnormality within the thorax. Electronically Signed   By: Fidela Salisbury M.D.   On: 07/23/2017 23:47   Assessment & Plan:  Andrea Watts is a 46 y.o. female with a right breast infection presumed to be from a brown recluse spider bite that occurred on Sunday. She has been on antibiotics  with worsening cellulitis. The area of ulceration on her right lateral breast is about 2.5cm circular and has fibrinous material overlying it.  On Ct no evidence of any drainabe collection.  Difficult to say if the area of necrosis has progressed to the max point at this time, and cellulitis is having some improvement with the antibiotics. Discussed with the patient possible need for surgical debridement given the necrosis if it continue to extend which is possible.    -IV antibiotics for now  -Santyl Bid with dampened gauze with saline  -Will follow clinically and make a determination about surgical debridement  -Will need a mammogram in the upcoming weeks/ months once healed   All questions were answered to the satisfaction of the patient. Updated Dr. Bishop Dublin 07/24/2017, 8:58 AM

## 2017-07-24 NOTE — Progress Notes (Signed)
Pharmacy Antibiotic Note  Andrea AmorMelissa P Gwaltney is a 46 y.o. female admitted on 07/23/2017 with cellulitis.  Pharmacy has been consulted for Vancomycin and zosyn dosing.  Plan: Vancomycin 2000mg  loading dose, then 1000mg  IV every 8 hours.  Goal trough 10-15 mcg/mL. Zosyn 3.375g IV q8h (4 hour infusion).  F/U cxs and clinical progress Monitor V/S, labs and levels as indicated  Height: 5\' 7"  (170.2 cm) Weight: 240 lb (108.9 kg) IBW/kg (Calculated) : 61.6  Temp (24hrs), Avg:97.8 F (36.6 C), Min:97.1 F (36.2 C), Max:98.7 F (37.1 C)  Recent Labs  Lab 07/23/17 2107 07/23/17 2243 07/24/17 0410  WBC 14.9*  --  12.3*  CREATININE 0.65  --  0.52  LATICACIDVEN  --  1.0  --     Normalized CRCL = 153 mls/min Estimated Creatinine Clearance: 112.9 mL/min (by C-G formula based on SCr of 0.52 mg/dL).    No Known Allergies  Antimicrobials this admission: Vancomycin 12/5>>  Zosyn 12/5 >>   Dose adjustments this admission: N/A  Microbiology results: 12/4 BCx: pending  Thank you for allowing pharmacy to be a part of this patient's care.  Elder CyphersLorie Bodhi Stenglein, BS Pharm D, [place] Clinical Pharmacist Pager [phone] 07/24/2017 8:52 AM

## 2017-07-24 NOTE — Progress Notes (Signed)
PROGRESS NOTE                                                                                                                                                                                                             Patient Demographics:    Andrea Watts, is a 46 y.o. female, DOB - 1970-10-18, ZOX:[id number]  Admit date - 07/23/2017   Admitting Physician Bobette Mo, MD  Outpatient Primary MD for the patient is Kirstie Peri, MD  LOS - 0  Outpatient Specialists:None  Chief Complaint  Patient presents with  . Wound Check       Brief Narrative   46 year old obese female with history of anxiety, depression, ADD,Presented to the ED with progressive redness , Pain and ulcer over her right breast. Symptoms started as a small bump 2 days back She went to [facility] to him and was doxycycline for cellulitis and discharged home. However Patient started developing ulceration around the area with surrounding Erythema, edema and purulent discharge. Also had associated chills but no fever. Patient admitted for Cellulitis and ulceration of the right breast.   Subjective:   Complains of pain over the right breast. Afebrile.   Assessment  & Plan :    Principal Problem:   Cellulitis of right breast With ulceration No underlying abscess Or drainable collection noted on CT. Continue empiric IV vancomycin. Will discontinue Zosyn. Follow cultures. Surgery consult appreciated. Recommend monitoring with IV antibiotic and wound care. Needs surgical debridement if no clinical improvement or worsening.  Active Problems:  Anxiety and depression Continue home medications      Code Status : Full code  Family Communication  :None at bedside  Disposition Plan  : Home pending clinical  Barriers For Discharge : Active symptoms  Consults  :  Surgery  Procedures  : CT of the breast  DVT Prophylaxis  :  Lovenox  Lab Results    Component Value Date   PLT 362 07/24/2017    Antibiotics  :    Anti-infectives (From admission, onward)   Start     Dose/Rate Route Frequency Ordered Stop   07/24/17 1600  vancomycin (VANCOCIN) IVPB 1000 mg/200 mL premix     1,000 mg 200 mL/hr over 60 Minutes Intravenous Every 8 hours 07/24/17 0749     07/24/17 0900  piperacillin-tazobactam (ZOSYN) IVPB 3.375 g  3.375 g 12.5 mL/hr over 240 Minutes Intravenous Every 8 hours 07/24/17 0745     07/24/17 0800  vancomycin (VANCOCIN) IVPB 1000 mg/200 mL premix     1,000 mg 200 mL/hr over 60 Minutes Intravenous Every 1 hr x 2 07/24/17 0749 07/24/17 1142   07/24/17 0700  vancomycin (VANCOCIN) 2,000 mg in sodium chloride 0.9 % 500 mL IVPB  Status:  Discontinued     2,000 mg 250 mL/hr over 120 Minutes Intravenous  Once 07/24/17 0156 07/24/17 0749   07/24/17 0245  piperacillin-tazobactam (ZOSYN) IVPB 3.375 g     3.375 g 12.5 mL/hr over 240 Minutes Intravenous  Once 07/24/17 0240 07/24/17 0851   07/24/17 0230  piperacillin-tazobactam (ZOSYN) IVPB 3.375 g  Status:  Discontinued     3.375 g 100 mL/hr over 30 Minutes Intravenous  Once 07/24/17 0156 07/24/17 0242   07/23/17 2215  ceFAZolin (ANCEF) IVPB 1 g/50 mL premix     1 g 100 mL/hr over 30 Minutes Intravenous  Once 07/23/17 2204 07/23/17 2311        Objective:   Vitals:   07/23/17 2220 07/24/17 0112 07/24/17 0610 07/24/17 1327  BP: 130/73 128/84 109/74 125/77  Pulse: 98 78 77 74  Resp: 16 18 18 18   Temp: 97.6 F (36.4 C) 98.7 F (37.1 C) (!) 97.1 F (36.2 C) 97.9 F (36.6 C)  TempSrc: Oral Oral Oral Oral  SpO2: 98% 99% 98% 96%  Weight:      Height:        Wt Readings from Last 3 Encounters:  07/23/17 108.9 kg (240 lb)  07/31/11 99.8 kg (220 lb)  06/28/11 95.3 kg (210 lb)     Intake/Output Summary (Last 24 hours) at 07/24/2017 1414 Last data filed at 07/24/2017 1200 Gross per 24 hour  Intake 290 ml  Output -  Net 290 ml     Physical Exam  Gen: In some  distress with pain HEENT:  moist mucosa, supple neck Chest: clear b/l, no added sounds, Erythema with swelling of the Right breast with 2.5 cm ulceration With fibrinous discharge. Tender to pressure. CVS: N S1&S2, no murmurs,  GI: soft, NT, ND,  Musculoskeletal: warm, no edema     Data Review:    CBC Recent Labs  Lab 07/23/17 2107 07/24/17 0410  WBC 14.9* 12.3*  HGB 14.4 13.7  HCT 43.8 42.8  PLT 389 362  MCV 93.6 94.3  MCH 30.8 30.2  MCHC 32.9 32.0  RDW 12.7 12.7  LYMPHSABS 4.1* 4.1*  MONOABS 0.7 0.9  EOSABS 0.2 0.2  BASOSABS 0.0 0.0    Chemistries  Recent Labs  Lab 07/23/17 2107 07/24/17 0410  NA 136 136  K 3.7 3.7  CL 100* 102  CO2 27 28  GLUCOSE 124* 102*  BUN 10 11  CREATININE 0.65 0.52  CALCIUM 9.3 9.0   ------------------------------------------------------------------------------------------------------------------ No results for input(s): CHOL, HDL, LDLCALC, TRIG, CHOLHDL, LDLDIRECT in the last 72 hours.  No results found for: HGBA1C ------------------------------------------------------------------------------------------------------------------ No results for input(s): TSH, T4TOTAL, T3FREE, THYROIDAB in the last 72 hours.  Invalid input(s): FREET3 ------------------------------------------------------------------------------------------------------------------ No results for input(s): VITAMINB12, FOLATE, FERRITIN, TIBC, IRON, RETICCTPCT in the last 72 hours.  Coagulation profile Recent Labs  Lab 07/24/17 0410  INR 1.04    No results for input(s): DDIMER in the last 72 hours.  Cardiac Enzymes No results for input(s): CKMB, TROPONINI, MYOGLOBIN in the last 168 hours.  Invalid input(s): CK ------------------------------------------------------------------------------------------------------------------ No results found for: BNP  Inpatient Medications  Scheduled Meds: . amphetamine-dextroamphetamine  20 mg Oral TID  . citalopram  40 mg  Oral Daily  . collagenase   Topical BID  . heparin  5,000 Units Subcutaneous Q8H  . ketorolac  30 mg Intravenous Q6H   Continuous Infusions: . sodium chloride    . piperacillin-tazobactam (ZOSYN)  IV Stopped (07/24/17 1348)  . vancomycin     PRN Meds:.ALPRAZolam, [START ON 07/25/2017] ketorolac, nicotine, ondansetron **OR** ondansetron (ZOFRAN) IV, oxyCODONE  Micro Results Recent Results (from the past 240 hour(s))  Blood culture (routine x 2)     Status: None (Preliminary result)   Collection Time: 07/23/17 10:43 PM  Result Value Ref Range Status   Specimen Description BLOOD RIGHT HAND  Final   Special Requests   Final    BOTTLES DRAWN AEROBIC AND ANAEROBIC Blood Culture results may not be optimal due to an excessive volume of blood received in culture bottles   Culture NO GROWTH < 12 HOURS  Final   Report Status PENDING  Incomplete  Blood culture (routine x 2)     Status: None (Preliminary result)   Collection Time: 07/23/17 10:49 PM  Result Value Ref Range Status   Specimen Description BLOOD LEFT HAND  Final   Special Requests   Final    BOTTLES DRAWN AEROBIC AND ANAEROBIC Blood Culture adequate volume   Culture NO GROWTH < 12 HOURS  Final   Report Status PENDING  Incomplete    Radiology Reports Ct Chest W Contrast  Result Date: 07/23/2017 CLINICAL DATA:  Painful right breast. EXAM: CT CHEST WITH CONTRAST TECHNIQUE: Multidetector CT imaging of the chest was performed during intravenous contrast administration. CONTRAST:  75mL ISOVUE-300 IOPAMIDOL (ISOVUE-300) INJECTION 61% COMPARISON:  Chest radiograph 04/17/2010 FINDINGS: Cardiovascular: No significant vascular findings. Normal heart size. No pericardial effusion. Mediastinum/Nodes: No enlarged mediastinal, hilar, or axillary lymph nodes. Thyroid gland, trachea, and esophagus demonstrate no significant findings. Lungs/Pleura: Lungs are clear. No pleural effusion or pneumothorax. Upper Abdomen: Marked hepatic steatosis. Otherwise  no acute findings. Musculoskeletal: Diffuse right lateral breast skin edema. 10 mm nodule in the right breast upper outer quadrant, posterior depth, with uncertain significance. Mildly enlarged, possibly reactive lymph nodes in the right axilla. No acute or significant osseous findings. IMPRESSION: Diffuse right lateral breast skin edema. 10 mm nodule in the right breast upper outer quadrant, posterior depth. Mildly enlarged, possibly reactive lymph nodes in the right axilla. These findings could be further evaluate with nonemergent mammography and focused right breast ultrasound. Otherwise no evidence of acute abnormality within the thorax. Electronically Signed   By: Ted Mcalpineobrinka  Dimitrova M.D.   On: 07/23/2017 23:47    Time Spent in minutes  20   Keshara Kiger M.D on 07/24/2017 at 2:14 PM  Between 7am to 7pm - Pager - [phone]  After 7pm go to [email or web address] - password [email or web address]  [facility] -  Office  [phone]

## 2017-07-24 NOTE — Progress Notes (Addendum)
Santyl ointment with gauze , abd pad  and paper tape applied to cellulitis area under right breast.  Area about quarter size open with purulent wound bed.   Redness to surrondingt areas marked with marker.  Significant other stated that the red areas look better than yesterday in that they are not as red.

## 2017-07-24 NOTE — Care Management Note (Addendum)
Case Management Note  Patient Details  Name: Andrea Watts MRN: [id number] Date of Birth: 1970/09/12  Subjective/Objective: Chart reviewed. No CM consult. From home, ind. Adm with cellulitis of breast, ? Spider bite. Surgical consulted and will follow to determine if debridement is needed. Patient does not have insurance, has been flagged by Artistinancial counselor and she  will assess  Medicaid eligibility. [place].                   Action/Plan: CM following for needs.   ADDENDUM; 07/25/2017 : ACCOUNT NOTES INDICATE FINANCIAL COUNSELOR WILL BE HELPING PATIENT APPLY FOR MEDICAID AND THAT SHE HAS BEEN DENIED TWICE FOR DISABILITY.   Expected Discharge Date:     Unknown at this time              Expected Discharge Plan:     In-House Referral:     Discharge planning Services  CM Consult  Post Acute Care Choice:    Choice offered to:     DME Arranged:    DME Agency:     HH Arranged:    HH Agency:     Status of Service:  In process, will continue to follow  If discussed at Long Length of Stay Meetings, dates discussed:    Additional Comments:  Cotina Freedman, Chrystine OilerSharley Diane, RN 07/24/2017, 11:18 AM

## 2017-07-24 NOTE — Progress Notes (Signed)
Pharmacy Antibiotic Note  Andrea RothmanMelissa P Watts Watts is a 46 y.o. female admitted on 07/23/2017 with cellulitis in right breast from presumed brown recluse spider bite.  Pharmacy has been consulted for Vancomycin and Zosyn dosing.  Plan: 1) Zosyn 3.375mg  IV X 1. 2) Vancomycin 2 g IV X 1 Target Vanc trough ~ 10-15 [facility] pharmacy team will follow up with maintenance doses.  Height: 5\' 7"  (170.2 cm) Weight: 240 lb (108.9 kg) IBW/kg (Calculated) : 61.6  Temp (24hrs), Avg:98 F (36.7 C), Min:97.6 F (36.4 C), Max:98.7 F (37.1 C)  Recent Labs  Lab 07/23/17 2107 07/23/17 2243  WBC 14.9*  --   CREATININE 0.65  --   LATICACIDVEN  --  1.0    Estimated Creatinine Clearance: 112.9 mL/min (by C-G formula based on SCr of 0.65 mg/dL).    No Known Allergies  Antimicrobials this admission:  [facility] Doxycycline 100mg  po BID 07/22/17     Thank you for allowing pharmacy to be a part of this patient's care.  Hovey-Rankin, Lenisha Lacap 07/24/2017 3:10 AM

## 2017-07-25 DIAGNOSIS — N611 Abscess of the breast and nipple: Secondary | ICD-10-CM

## 2017-07-25 LAB — CBC WITH DIFFERENTIAL/PLATELET
Basophils Absolute: 0 10*3/uL (ref 0.0–0.1)
Basophils Relative: 0 %
Eosinophils Absolute: 0.2 10*3/uL (ref 0.0–0.7)
Eosinophils Relative: 2 %
HEMATOCRIT: 45.3 % (ref 36.0–46.0)
HEMOGLOBIN: 14.5 g/dL (ref 12.0–15.0)
LYMPHS ABS: 4.1 10*3/uL — AB (ref 0.7–4.0)
LYMPHS PCT: 37 %
MCH: 30.4 pg (ref 26.0–34.0)
MCHC: 32 g/dL (ref 30.0–36.0)
MCV: 95 fL (ref 78.0–100.0)
MONOS PCT: 7 %
Monocytes Absolute: 0.8 10*3/uL (ref 0.1–1.0)
NEUTROS PCT: 54 %
Neutro Abs: 5.9 10*3/uL (ref 1.7–7.7)
Platelets: 404 10*3/uL — ABNORMAL HIGH (ref 150–400)
RBC: 4.77 MIL/uL (ref 3.87–5.11)
RDW: 12.8 % (ref 11.5–15.5)
WBC: 11 10*3/uL — AB (ref 4.0–10.5)

## 2017-07-25 LAB — BASIC METABOLIC PANEL
Anion gap: 8 (ref 5–15)
BUN: 10 mg/dL (ref 6–20)
CALCIUM: 8.9 mg/dL (ref 8.9–10.3)
CHLORIDE: 102 mmol/L (ref 101–111)
CO2: 27 mmol/L (ref 22–32)
CREATININE: 0.58 mg/dL (ref 0.44–1.00)
GFR calc non Af Amer: >60 mL/min (ref >60)
GLUCOSE: 97 mg/dL (ref 65–99)
Potassium: 3.8 mmol/L (ref 3.5–5.1)
Sodium: 137 mmol/L (ref 135–145)

## 2017-07-25 LAB — HIV ANTIBODY (ROUTINE TESTING W REFLEX): HIV Screen 4th Generation wRfx: NONREACTIVE

## 2017-07-25 NOTE — Progress Notes (Signed)
[facility] Progress Note     Subjective: No major issues. Pain improved. Swelling and erythema improved. Dressing changes going well.   Objective: Vital signs in last 24 hours: Temp:  [97.9 F (36.6 C)-98.2 F (36.8 C)] 98.2 F (36.8 C) (12/05 2147) Pulse Rate:  [74-75] 75 (12/05 2147) Resp:  [18-19] 19 (12/05 2147) BP: (122-125)/(77) 122/77 (12/05 2147) SpO2:  [96 %-97 %] 97 % (12/05 2147) Last BM Date: 07/23/17  Intake/Output from previous day: 12/05 0701 - 12/06 0700 In: 680 [P.O.:480; IV Piggyback:200] Out: -  Intake/Output this shift: No intake/output data recorded.  General appearance: alert, cooperative and no distress Resp: normal work breathing Breasts: normal appearance, no masses or tenderness, right breast cellulitis improving, ulceration with reduced fibrinous material, no further signs of necrosis expanding  Lab Results:  Recent Labs    07/24/17 0410 07/25/17 0428  WBC 12.3* 11.0*  HGB 13.7 14.5  HCT 42.8 45.3  PLT 362 404*   BMET Recent Labs    07/24/17 0410 07/25/17 0428  NA 136 137  K 3.7 3.8  CL 102 102  CO2 28 27  GLUCOSE 102* 97  BUN 11 10  CREATININE 0.52 0.58  CALCIUM 9.0 8.9   PT/INR Recent Labs    07/24/17 0410  LABPROT 13.5  INR 1.04    Studies/Results: Ct Chest W Contrast  Result Date: 07/23/2017 CLINICAL DATA:  Painful right breast. EXAM: CT CHEST WITH CONTRAST TECHNIQUE: Multidetector CT imaging of the chest was performed during intravenous contrast administration. CONTRAST:  75mL ISOVUE-300 IOPAMIDOL (ISOVUE-300) INJECTION 61% COMPARISON:  Chest radiograph 04/17/2010 FINDINGS: Cardiovascular: No significant vascular findings. Normal heart size. No pericardial effusion. Mediastinum/Nodes: No enlarged mediastinal, hilar, or axillary lymph nodes. Thyroid gland, trachea, and esophagus demonstrate no significant findings. Lungs/Pleura: Lungs are clear. No pleural effusion or pneumothorax. Upper Abdomen: Marked  hepatic steatosis. Otherwise no acute findings. Musculoskeletal: Diffuse right lateral breast skin edema. 10 mm nodule in the right breast upper outer quadrant, posterior depth, with uncertain significance. Mildly enlarged, possibly reactive lymph nodes in the right axilla. No acute or significant osseous findings. IMPRESSION: Diffuse right lateral breast skin edema. 10 mm nodule in the right breast upper outer quadrant, posterior depth. Mildly enlarged, possibly reactive lymph nodes in the right axilla. These findings could be further evaluate with nonemergent mammography and focused right breast ultrasound. Otherwise no evidence of acute abnormality within the thorax. Electronically Signed   By: Ted Mcalpineobrinka  Dimitrova M.D.   On: 07/23/2017 23:47    Anti-infectives: Anti-infectives (From admission, onward)   Start     Dose/Rate Route Frequency Ordered Stop   07/24/17 1600  vancomycin (VANCOCIN) IVPB 1000 mg/200 mL premix     1,000 mg 200 mL/hr over 60 Minutes Intravenous Every 8 hours 07/24/17 0749     07/24/17 0900  piperacillin-tazobactam (ZOSYN) IVPB 3.375 g  Status:  Discontinued     3.375 g 12.5 mL/hr over 240 Minutes Intravenous Every 8 hours 07/24/17 0745 07/24/17 1422   07/24/17 0800  vancomycin (VANCOCIN) IVPB 1000 mg/200 mL premix     1,000 mg 200 mL/hr over 60 Minutes Intravenous Every 1 hr x 2 07/24/17 0749 07/24/17 1142   07/24/17 0700  vancomycin (VANCOCIN) 2,000 mg in sodium chloride 0.9 % 500 mL IVPB  Status:  Discontinued     2,000 mg 250 mL/hr over 120 Minutes Intravenous  Once 07/24/17 0156 07/24/17 0749   07/24/17 0245  piperacillin-tazobactam (ZOSYN) IVPB 3.375 g     3.375 g  12.5 mL/hr over 240 Minutes Intravenous  Once 07/24/17 0240 07/24/17 0851   07/24/17 0230  piperacillin-tazobactam (ZOSYN) IVPB 3.375 g  Status:  Discontinued     3.375 g 100 mL/hr over 30 Minutes Intravenous  Once 07/24/17 0156 07/24/17 0242   07/23/17 2215  ceFAZolin (ANCEF) IVPB 1 g/50 mL premix      1 g 100 mL/hr over 30 Minutes Intravenous  Once 07/23/17 2204 07/23/17 2311      Assessment/Plan: Andrea Watts is a 46 y.o. female with a right breast infection presumed to be from a brown recluse spider bite. Improving.  -Continue IV antibiotics  -Continue BID santyl with dampened saline gauze to ulceration  -Will likely be in house for IV antibiotics until weekend  -Will follow -No surgical debridement indicated at this time   Updated Dr. Gonzella Lexhungel.      LOS: 1 day    Lucretia RoersLindsay C Bridges 07/25/2017

## 2017-07-25 NOTE — Progress Notes (Signed)
Dr. Henreitta LeberBridges assessed right breast wound this morning and decided at this point to continue iv abx and santyl treatment with no surgery. Open area continues to be about quarter-size with yellow wound bed and erythema surrounding wound with original area marked. Has been ambulating in hallway today.

## 2017-07-25 NOTE — Progress Notes (Addendum)
PROGRESS NOTE                                                                                                                                                                                                             Patient Demographics:    Andrea Watts, is a 46 y.o. female, DOB - [birth year], WUJ:[id number]  Admit date - 07/23/2017   Admitting Physician Bobette Mo, MD  Outpatient Primary MD for the patient is Kirstie Peri, MD  LOS - 1  Outpatient Specialists:None  Chief Complaint  Patient presents with  . Wound Check       Brief Narrative   46 year old obese female with history of anxiety, depression, ADD,Presented to the ED with progressive redness , Pain and ulcer over her right breast. Symptoms started as a small bump 2 days back She went to [facility] to him and was doxycycline for cellulitis and discharged home. However Patient started developing ulceration around the area with surrounding Erythema, edema and purulent discharge. Also had associated chills but no fever. Patient admitted for Cellulitis and ulceration of the right breast.   Subjective:   Pain and swelling over the right breast better. Has Less discharge from the ulceration.Afebrile.   Assessment  & Plan :    Principal Problem:   Cellulitis of right breast With ulceration No underlying abscess Or drainable collection noted on CT. Antibiotic narrowed to IV cefazolin.  Cultures so far negative. Surgery consult following and recommend continuing IV antibiotics for next few days and wound care. Hopefully won't need surgical debridement.  Continue pain control.    Active Problems:  Anxiety and depression Continue home medications      Code Status : Full code  Family Communication  :Wife at bedside  Disposition Plan  : Home Possibly next 3-4 days if improved  Barriers For Discharge : Active symptoms  Consults  :  Surgery  Procedures   : CT of the breast  DVT Prophylaxis  :  Lovenox  Lab Results  Component Value Date   PLT 404 (H) 07/25/2017    Antibiotics  :    Anti-infectives (From admission, onward)   Start     Dose/Rate Route Frequency Ordered Stop   07/24/17 1600  vancomycin (VANCOCIN) IVPB 1000 mg/200 mL premix     1,000 mg 200 mL/hr over 60 Minutes Intravenous  Every 8 hours 07/24/17 0749     07/24/17 0900  piperacillin-tazobactam (ZOSYN) IVPB 3.375 g  Status:  Discontinued     3.375 g 12.5 mL/hr over 240 Minutes Intravenous Every 8 hours 07/24/17 0745 07/24/17 1422   07/24/17 0800  vancomycin (VANCOCIN) IVPB 1000 mg/200 mL premix     1,000 mg 200 mL/hr over 60 Minutes Intravenous Every 1 hr x 2 07/24/17 0749 07/24/17 1142   07/24/17 0700  vancomycin (VANCOCIN) 2,000 mg in sodium chloride 0.9 % 500 mL IVPB  Status:  Discontinued     2,000 mg 250 mL/hr over 120 Minutes Intravenous  Once 07/24/17 0156 07/24/17 0749   07/24/17 0245  piperacillin-tazobactam (ZOSYN) IVPB 3.375 g     3.375 g 12.5 mL/hr over 240 Minutes Intravenous  Once 07/24/17 0240 07/24/17 0851   07/24/17 0230  piperacillin-tazobactam (ZOSYN) IVPB 3.375 g  Status:  Discontinued     3.375 g 100 mL/hr over 30 Minutes Intravenous  Once 07/24/17 0156 07/24/17 0242   07/23/17 2215  ceFAZolin (ANCEF) IVPB 1 g/50 mL premix     1 g 100 mL/hr over 30 Minutes Intravenous  Once 07/23/17 2204 07/23/17 2311        Objective:   Vitals:   07/24/17 0112 07/24/17 0610 07/24/17 1327 07/24/17 2147  BP: 128/84 109/74 125/77 122/77  Pulse: 78 77 74 75  Resp: 18 18 18 19   Temp: 98.7 F (37.1 C) (!) 97.1 F (36.2 C) 97.9 F (36.6 C) 98.2 F (36.8 C)  TempSrc: Oral Oral Oral Oral  SpO2: 99% 98% 96% 97%  Weight:      Height:        Wt Readings from Last 3 Encounters:  07/23/17 108.9 kg (240 lb)  07/31/11 99.8 kg (220 lb)  06/28/11 95.3 kg (210 lb)     Intake/Output Summary (Last 24 hours) at 07/25/2017 1116 Last data filed at 07/24/2017  1711 Gross per 24 hour  Intake 680 ml  Output -  Net 680 ml     Physical Exam General: Middle aged obese female not in distress HEENT: Moist mucosa, supple neck Chest: Clear bilaterally, erythema with swelling of the right breast (improved from yesterday). 2.5 similar ulceration with minimal fibrinous discharge. Tender to pressure(Improved) CVS: Normal S1 and S2, no murmurs GI: Soft, nondistended, nontender musculoskeletal: Warm, no edema     Data Review:    CBC Recent Labs  Lab 07/23/17 2107 07/24/17 0410 07/25/17 0428  WBC 14.9* 12.3* 11.0*  HGB 14.4 13.7 14.5  HCT 43.8 42.8 45.3  PLT 389 362 404*  MCV 93.6 94.3 95.0  MCH 30.8 30.2 30.4  MCHC 32.9 32.0 32.0  RDW 12.7 12.7 12.8  LYMPHSABS 4.1* 4.1* 4.1*  MONOABS 0.7 0.9 0.8  EOSABS 0.2 0.2 0.2  BASOSABS 0.0 0.0 0.0    Chemistries  Recent Labs  Lab 07/23/17 2107 07/24/17 0410 07/25/17 0428  NA 136 136 137  K 3.7 3.7 3.8  CL 100* 102 102  CO2 27 28 27   GLUCOSE 124* 102* 97  BUN 10 11 10   CREATININE 0.65 0.52 0.58  CALCIUM 9.3 9.0 8.9   ------------------------------------------------------------------------------------------------------------------ No results for input(s): CHOL, HDL, LDLCALC, TRIG, CHOLHDL, LDLDIRECT in the last 72 hours.  No results found for: HGBA1C ------------------------------------------------------------------------------------------------------------------ No results for input(s): TSH, T4TOTAL, T3FREE, THYROIDAB in the last 72 hours.  Invalid input(s): FREET3 ------------------------------------------------------------------------------------------------------------------ No results for input(s): VITAMINB12, FOLATE, FERRITIN, TIBC, IRON, RETICCTPCT in the last 72 hours.  Coagulation profile Recent Labs  Lab 07/24/17 0410  INR 1.04    No results for input(s): DDIMER in the last 72 hours.  Cardiac Enzymes No results for input(s): CKMB, TROPONINI, MYOGLOBIN in the last  168 hours.  Invalid input(s): CK ------------------------------------------------------------------------------------------------------------------ No results found for: BNP  Inpatient Medications  Scheduled Meds: . amphetamine-dextroamphetamine  20 mg Oral TID  . citalopram  40 mg Oral Daily  . collagenase   Topical BID  . heparin  5,000 Units Subcutaneous Q8H   Continuous Infusions: . sodium chloride 100 mL/hr at 07/25/17 0854  . vancomycin 1,000 mg (07/25/17 0854)   PRN Meds:.ALPRAZolam, ketorolac, nicotine, ondansetron **OR** ondansetron (ZOFRAN) IV, oxyCODONE  Micro Results Recent Results (from the past 240 hour(s))  Wound or Superficial Culture     Status: None (Preliminary result)   Collection Time: 07/23/17 10:01 PM  Result Value Ref Range Status   Specimen Description BREAST  Final   Special Requests Normal  Final   Gram Stain   Final    RARE WBC PRESENT, PREDOMINANTLY PMN NO ORGANISMS SEEN Performed at [facility], [address] [place], [place] [address]    Culture PENDING  Incomplete   Report Status PENDING  Incomplete  Blood culture (routine x 2)     Status: None (Preliminary result)   Collection Time: 07/23/17 10:43 PM  Result Value Ref Range Status   Specimen Description BLOOD RIGHT HAND  Final   Special Requests   Final    BOTTLES DRAWN AEROBIC AND ANAEROBIC Blood Culture results may not be optimal due to an excessive volume of blood received in culture bottles   Culture NO GROWTH 2 DAYS  Final   Report Status PENDING  Incomplete  Blood culture (routine x 2)     Status: None (Preliminary result)   Collection Time: 07/23/17 10:49 PM  Result Value Ref Range Status   Specimen Description BLOOD LEFT HAND  Final   Special Requests   Final    BOTTLES DRAWN AEROBIC AND ANAEROBIC Blood Culture adequate volume   Culture NO GROWTH 2 DAYS  Final   Report Status PENDING  Incomplete    Radiology Reports Ct Chest W Contrast  Result Date:  07/23/2017 CLINICAL DATA:  Painful right breast. EXAM: CT CHEST WITH CONTRAST TECHNIQUE: Multidetector CT imaging of the chest was performed during intravenous contrast administration. CONTRAST:  75mL ISOVUE-300 IOPAMIDOL (ISOVUE-300) INJECTION 61% COMPARISON:  Chest radiograph 04/17/2010 FINDINGS: Cardiovascular: No significant vascular findings. Normal heart size. No pericardial effusion. Mediastinum/Nodes: No enlarged mediastinal, hilar, or axillary lymph nodes. Thyroid gland, trachea, and esophagus demonstrate no significant findings. Lungs/Pleura: Lungs are clear. No pleural effusion or pneumothorax. Upper Abdomen: Marked hepatic steatosis. Otherwise no acute findings. Musculoskeletal: Diffuse right lateral breast skin edema. 10 mm nodule in the right breast upper outer quadrant, posterior depth, with uncertain significance. Mildly enlarged, possibly reactive lymph nodes in the right axilla. No acute or significant osseous findings. IMPRESSION: Diffuse right lateral breast skin edema. 10 mm nodule in the right breast upper outer quadrant, posterior depth. Mildly enlarged, possibly reactive lymph nodes in the right axilla. These findings could be further evaluate with nonemergent mammography and focused right breast ultrasound. Otherwise no evidence of acute abnormality within the thorax. Electronically Signed   By: Ted Mcalpineobrinka  Dimitrova M.D.   On: 07/23/2017 23:47    Time Spent in minutes  25   Niyati Heinke M.D on 07/25/2017 at 11:16 AM  Between 7am to 7pm - Pager - [phone]  After 7pm go to [email or web address] -  password [facility]  [facility] -  Office  [phone]

## 2017-07-26 LAB — BASIC METABOLIC PANEL
Anion gap: 8 (ref 5–15)
BUN: 7 mg/dL (ref 6–20)
CO2: 27 mmol/L (ref 22–32)
CREATININE: 0.49 mg/dL (ref 0.44–1.00)
Calcium: 9.2 mg/dL (ref 8.9–10.3)
Chloride: 100 mmol/L — ABNORMAL LOW (ref 101–111)
GFR calc Af Amer: >60 mL/min (ref >60)
GLUCOSE: 102 mg/dL — AB (ref 65–99)
Potassium: 3.6 mmol/L (ref 3.5–5.1)
SODIUM: 135 mmol/L (ref 135–145)

## 2017-07-26 LAB — CBC WITH DIFFERENTIAL/PLATELET
Basophils Absolute: 0 10*3/uL (ref 0.0–0.1)
Basophils Relative: 0 %
EOS ABS: 0.1 10*3/uL (ref 0.0–0.7)
EOS PCT: 1 %
HCT: 44.7 % (ref 36.0–46.0)
Hemoglobin: 14.3 g/dL (ref 12.0–15.0)
LYMPHS ABS: 3.3 10*3/uL (ref 0.7–4.0)
Lymphocytes Relative: 34 %
MCH: 29.9 pg (ref 26.0–34.0)
MCHC: 32 g/dL (ref 30.0–36.0)
MCV: 93.3 fL (ref 78.0–100.0)
MONO ABS: 0.6 10*3/uL (ref 0.1–1.0)
MONOS PCT: 6 %
Neutro Abs: 5.7 10*3/uL (ref 1.7–7.7)
Neutrophils Relative %: 59 %
PLATELETS: 403 10*3/uL — AB (ref 150–400)
RBC: 4.79 MIL/uL (ref 3.87–5.11)
RDW: 12.5 % (ref 11.5–15.5)
WBC: 9.7 10*3/uL (ref 4.0–10.5)

## 2017-07-26 MED ORDER — ALUM & MAG HYDROXIDE-SIMETH 200-200-20 MG/5ML PO SUSP
30.0000 mL | ORAL | Status: DC | PRN
  Administered 2017-07-26: 30 mL via ORAL
  Filled 2017-07-26: qty 30

## 2017-07-26 MED ORDER — PANTOPRAZOLE SODIUM 40 MG PO TBEC
40.0000 mg | DELAYED_RELEASE_TABLET | Freq: Every day | ORAL | Status: DC
  Administered 2017-07-26 – 2017-07-27 (×2): 40 mg via ORAL
  Filled 2017-07-26 (×2): qty 1

## 2017-07-26 NOTE — Care Management Note (Signed)
Case Management Note  Patient Details  Name: Andrea Watts MRN: [id number] Date of Birth: July 09, 1971  Expected Discharge Date:      07/27/2017            Expected Discharge Plan:  Home/Self Care  In-House Referral:  NA  Discharge planning Services  CM Consult, MATCH Program  Post Acute Care Choice:  NA Choice offered to:  NA  Status of Service:  Completed, signed off  Additional Comments: Pt will DC home tomorrow with ABX and mediccations for wound care. Pt given MATCH voucher to assist with costs of meds. Pt already uses [facility]. Plans to use MATCH if cost with [facility] is to much.   Malcolm Metrohildress, Rusty Glodowski Demske, RN 07/26/2017, 2:07 PM

## 2017-07-26 NOTE — Progress Notes (Signed)
PROGRESS NOTE                                                                                                                                                                                                             Patient Demographics:    Andrea RoeMelissa Watts, is a 46 y.o. female, DOB - 10/15/1970, FAO:[id number]  Admit date - 07/23/2017   Admitting Physician Bobette Moavid Manuel Ortiz, MD  Outpatient Primary MD for the patient is Kirstie PeriShah, Ashish, MD  LOS - 2  Outpatient Specialists:None  Chief Complaint  Patient presents with  . Wound Check       Brief Narrative   46 year old obese female with history of anxiety, depression, ADD,Presented to the ED with progressive redness , Pain and ulcer over her right breast. Symptoms started as a small bump 2 days back [facility] and was doxycycline for cellulitis and discharged home. However Patient started developing ulceration around the area with surrounding Erythema, edema and purulent discharge. Also had associated chills but no fever. Patient admitted for Cellulitis and ulceration of the right breast.   Subjective:   Symptoms improving.Pain and swelling over the right breast better. Minimal discharge.   Assessment  & Plan :    Principal Problem:   Cellulitis of right breast With ulceration No underlying abscess Or drainable collection noted on CT. Antibiotic narrowed to IV cefazolin.  Cultures so far negative. Surgery consult following and recommend continuing IV antibiotics for today then transitioned to oral antibiotics tomorrow and discharge her With outpatient follow-up  With Dr Henreitta LeberBridges on 12/13. Pain control as needed. Patient encouraged to ambulate.  Active Problems:  Anxiety and depression Continue home medications      Code Status : Full code  Family Communication  :None at bedside  Disposition Plan  : Home In a.m. If continues to improve  Barriers For  Discharge : Active symptoms  Consults  :  Surgery  Procedures  : CT of the breast  DVT Prophylaxis  :  Lovenox  Lab Results  Component Value Date   PLT 403 (H) 07/26/2017    Antibiotics  :    Anti-infectives (From admission, onward)   Start     Dose/Rate Route Frequency Ordered Stop   07/24/17 1600  vancomycin (VANCOCIN) IVPB 1000 mg/200 mL premix     1,000  mg 200 mL/hr over 60 Minutes Intravenous Every 8 hours 07/24/17 0749     07/24/17 0900  piperacillin-tazobactam (ZOSYN) IVPB 3.375 g  Status:  Discontinued     3.375 g 12.5 mL/hr over 240 Minutes Intravenous Every 8 hours 07/24/17 0745 07/24/17 1422   07/24/17 0800  vancomycin (VANCOCIN) IVPB 1000 mg/200 mL premix     1,000 mg 200 mL/hr over 60 Minutes Intravenous Every 1 hr x 2 07/24/17 0749 07/24/17 1142   07/24/17 0700  vancomycin (VANCOCIN) 2,000 mg in sodium chloride 0.9 % 500 mL IVPB  Status:  Discontinued     2,000 mg 250 mL/hr over 120 Minutes Intravenous  Once 07/24/17 0156 07/24/17 0749   07/24/17 0245  piperacillin-tazobactam (ZOSYN) IVPB 3.375 g     3.375 g 12.5 mL/hr over 240 Minutes Intravenous  Once 07/24/17 0240 07/24/17 0851   07/24/17 0230  piperacillin-tazobactam (ZOSYN) IVPB 3.375 g  Status:  Discontinued     3.375 g 100 mL/hr over 30 Minutes Intravenous  Once 07/24/17 0156 07/24/17 0242   07/23/17 2215  ceFAZolin (ANCEF) IVPB 1 g/50 mL premix     1 g 100 mL/hr over 30 Minutes Intravenous  Once 07/23/17 2204 07/23/17 2311        Objective:   Vitals:   07/24/17 2147 07/25/17 1358 07/25/17 2235 07/26/17 0543  BP: 122/77 127/72 118/62 120/66  Pulse: 75 84 72 72  Resp: 19 18 20 20   Temp: 98.2 F (36.8 C) 97.7 F (36.5 C) (!) 97.5 F (36.4 C) 98 F (36.7 C)  TempSrc: Oral Oral Oral Oral  SpO2: 97% 96% 98% 98%  Weight:      Height:        Wt Readings from Last 3 Encounters:  07/23/17 108.9 kg (240 lb)  07/31/11 99.8 kg (220 lb)  06/28/11 95.3 kg (210 lb)     Intake/Output Summary  (Last 24 hours) at 07/26/2017 1113 Last data filed at 07/26/2017 0900 Gross per 24 hour  Intake 1760 ml  Output -  Net 1760 ml     Physical Exam Gen.: Not in distress   HEENT: Moist mucosa, supple neck Chest: Clear bilaterally, erythema with swelling of the right breast with 2.5 cm ulcer with minimal fibrinous discharge. Tender to pressure (erythema, swelling and tenderness  improved) CVS: Normal S1 and S2, no murmurs GI: Soft, nondistended, nontender Musculoskeletal: Warm, no edema      Data Review:    CBC Recent Labs  Lab 07/23/17 2107 07/24/17 0410 07/25/17 0428 07/26/17 0551  WBC 14.9* 12.3* 11.0* 9.7  HGB 14.4 13.7 14.5 14.3  HCT 43.8 42.8 45.3 44.7  PLT 389 362 404* 403*  MCV 93.6 94.3 95.0 93.3  MCH 30.8 30.2 30.4 29.9  MCHC 32.9 32.0 32.0 32.0  RDW 12.7 12.7 12.8 12.5  LYMPHSABS 4.1* 4.1* 4.1* 3.3  MONOABS 0.7 0.9 0.8 0.6  EOSABS 0.2 0.2 0.2 0.1  BASOSABS 0.0 0.0 0.0 0.0    Chemistries  Recent Labs  Lab 07/23/17 2107 07/24/17 0410 07/25/17 0428 07/26/17 0551  NA 136 136 137 135  K 3.7 3.7 3.8 3.6  CL 100* 102 102 100*  CO2 27 28 27 27   GLUCOSE 124* 102* 97 102*  BUN 10 11 10 7   CREATININE 0.65 0.52 0.58 0.49  CALCIUM 9.3 9.0 8.9 9.2   ------------------------------------------------------------------------------------------------------------------ No results for input(s): CHOL, HDL, LDLCALC, TRIG, CHOLHDL, LDLDIRECT in the last 72 hours.  No results found for: HGBA1C ------------------------------------------------------------------------------------------------------------------ No results for input(s):  TSH, T4TOTAL, T3FREE, THYROIDAB in the last 72 hours.  Invalid input(s): FREET3 ------------------------------------------------------------------------------------------------------------------ No results for input(s): VITAMINB12, FOLATE, FERRITIN, TIBC, IRON, RETICCTPCT in the last 72 hours.  Coagulation profile Recent Labs  Lab  07/24/17 0410  INR 1.04    No results for input(s): DDIMER in the last 72 hours.  Cardiac Enzymes No results for input(s): CKMB, TROPONINI, MYOGLOBIN in the last 168 hours.  Invalid input(s): CK ------------------------------------------------------------------------------------------------------------------ No results found for: BNP  Inpatient Medications  Scheduled Meds: . amphetamine-dextroamphetamine  20 mg Oral TID  . citalopram  40 mg Oral Daily  . collagenase   Topical BID  . heparin  5,000 Units Subcutaneous Q8H   Continuous Infusions: . vancomycin Stopped (07/26/17 0119)   PRN Meds:.ALPRAZolam, ketorolac, nicotine, ondansetron **OR** ondansetron (ZOFRAN) IV, oxyCODONE  Micro Results Recent Results (from the past 240 hour(s))  Wound or Superficial Culture     Status: None (Preliminary result)   Collection Time: 07/23/17 10:01 PM  Result Value Ref Range Status   Specimen Description BREAST  Final   Special Requests Normal  Final   Gram Stain   Final    RARE WBC PRESENT, PREDOMINANTLY PMN NO ORGANISMS SEEN    Culture   Final    CULTURE REINCUBATED FOR BETTER GROWTH Performed at [facility], [address] [place], [place] [address]    Report Status PENDING  Incomplete  Blood culture (routine x 2)     Status: None (Preliminary result)   Collection Time: 07/23/17 10:43 PM  Result Value Ref Range Status   Specimen Description BLOOD RIGHT HAND  Final   Special Requests   Final    BOTTLES DRAWN AEROBIC AND ANAEROBIC Blood Culture results may not be optimal due to an excessive volume of blood received in culture bottles   Culture NO GROWTH 3 DAYS  Final   Report Status PENDING  Incomplete  Blood culture (routine x 2)     Status: None (Preliminary result)   Collection Time: 07/23/17 10:49 PM  Result Value Ref Range Status   Specimen Description BLOOD LEFT HAND  Final   Special Requests   Final    BOTTLES DRAWN AEROBIC AND ANAEROBIC Blood Culture adequate  volume   Culture NO GROWTH 3 DAYS  Final   Report Status PENDING  Incomplete    Radiology Reports Ct Chest W Contrast  Result Date: 07/23/2017 CLINICAL DATA:  Painful right breast. EXAM: CT CHEST WITH CONTRAST TECHNIQUE: Multidetector CT imaging of the chest was performed during intravenous contrast administration. CONTRAST:  75mL ISOVUE-300 IOPAMIDOL (ISOVUE-300) INJECTION 61% COMPARISON:  Chest radiograph 04/17/2010 FINDINGS: Cardiovascular: No significant vascular findings. Normal heart size. No pericardial effusion. Mediastinum/Nodes: No enlarged mediastinal, hilar, or axillary lymph nodes. Thyroid gland, trachea, and esophagus demonstrate no significant findings. Lungs/Pleura: Lungs are clear. No pleural effusion or pneumothorax. Upper Abdomen: Marked hepatic steatosis. Otherwise no acute findings. Musculoskeletal: Diffuse right lateral breast skin edema. 10 mm nodule in the right breast upper outer quadrant, posterior depth, with uncertain significance. Mildly enlarged, possibly reactive lymph nodes in the right axilla. No acute or significant osseous findings. IMPRESSION: Diffuse right lateral breast skin edema. 10 mm nodule in the right breast upper outer quadrant, posterior depth. Mildly enlarged, possibly reactive lymph nodes in the right axilla. These findings could be further evaluate with nonemergent mammography and focused right breast ultrasound. Otherwise no evidence of acute abnormality within the thorax. Electronically Signed   By: Ted Mcalpineobrinka  Dimitrova M.D.   On: 07/23/2017 23:47  Time Spent in minutes  25   Donavin Audino M.D on 07/26/2017 at 11:13 AM  Between 7am to 7pm - Pager - [phone]  After 7pm go to [email or web address] - password [email or web address]  [facility] -  Office  [phone]

## 2017-07-26 NOTE — Progress Notes (Signed)
[facility] Progress Note     Subjective: No major issues. No complaints.   Objective: Vital signs in last 24 hours: Temp:  [97.5 F (36.4 C)-98 F (36.7 C)] 98 F (36.7 C) (12/07 0543) Pulse Rate:  [72-84] 72 (12/07 0543) Resp:  [18-20] 20 (12/07 0543) BP: (118-127)/(62-72) 120/66 (12/07 0543) SpO2:  [96 %-98 %] 98 % (12/07 0543) Last BM Date: 07/23/17  Intake/Output from previous day: 12/06 0701 - 12/07 0700 In: 1520 [P.O.:720; IV Piggyback:800] Out: -  Intake/Output this shift: No intake/output data recorded.  General appearance: alert, cooperative and no distress Resp: normal work breathing Breasts: normal appearance, no masses or tenderness, right breast cellulitis improved, less tender and indurated, ulcerated area with very minimal fibrinous tissue      Lab Results:  Recent Labs    07/25/17 0428 07/26/17 0551  WBC 11.0* 9.7  HGB 14.5 14.3  HCT 45.3 44.7  PLT 404* 403*   BMET Recent Labs    07/25/17 0428 07/26/17 0551  NA 137 135  K 3.8 3.6  CL 102 100*  CO2 27 27  GLUCOSE 97 102*  BUN 10 7  CREATININE 0.58 0.49  CALCIUM 8.9 9.2   PT/INR Recent Labs    07/24/17 0410  LABPROT 13.5  INR 1.04    Studies/Results: No results found.  Anti-infectives: Anti-infectives (From admission, onward)   Start     Dose/Rate Route Frequency Ordered Stop   07/24/17 1600  vancomycin (VANCOCIN) IVPB 1000 mg/200 mL premix     1,000 mg 200 mL/hr over 60 Minutes Intravenous Every 8 hours 07/24/17 0749     07/24/17 0900  piperacillin-tazobactam (ZOSYN) IVPB 3.375 g  Status:  Discontinued     3.375 g 12.5 mL/hr over 240 Minutes Intravenous Every 8 hours 07/24/17 0745 07/24/17 1422   07/24/17 0800  vancomycin (VANCOCIN) IVPB 1000 mg/200 mL premix     1,000 mg 200 mL/hr over 60 Minutes Intravenous Every 1 hr x 2 07/24/17 0749 07/24/17 1142   07/24/17 0700  vancomycin (VANCOCIN) 2,000 mg in sodium chloride 0.9 % 500 mL IVPB  Status:   Discontinued     2,000 mg 250 mL/hr over 120 Minutes Intravenous  Once 07/24/17 0156 07/24/17 0749   07/24/17 0245  piperacillin-tazobactam (ZOSYN) IVPB 3.375 g     3.375 g 12.5 mL/hr over 240 Minutes Intravenous  Once 07/24/17 0240 07/24/17 0851   07/24/17 0230  piperacillin-tazobactam (ZOSYN) IVPB 3.375 g  Status:  Discontinued     3.375 g 100 mL/hr over 30 Minutes Intravenous  Once 07/24/17 0156 07/24/17 0242   07/23/17 2215  ceFAZolin (ANCEF) IVPB 1 g/50 mL premix     1 g 100 mL/hr over 30 Minutes Intravenous  Once 07/23/17 2204 07/23/17 2311      Assessment/Plan: Ms. Daphine DeutscherMartin is a 46 yo femalewith a right breast infection presumed to be from a brown recluse spider bite.  -Continue IV antibiotics through to Saturday and then can transition to oral antibiotics  -Continue BID santyl with dampened saline gauze to ulceration, will go home with this dressing plan    -No surgical debridement indicated at this time  -D/c home tomorrow AM with oral antibiotics and BID santyl dressing  -Follow up with me Thursday 08/01/2017   LOS: 2 days    Lucretia RoersLindsay C Zaven Klemens 07/26/2017

## 2017-07-27 DIAGNOSIS — L98499 Non-pressure chronic ulcer of skin of other sites with unspecified severity: Secondary | ICD-10-CM | POA: Diagnosis present

## 2017-07-27 DIAGNOSIS — N611 Abscess of the breast and nipple: Secondary | ICD-10-CM | POA: Diagnosis present

## 2017-07-27 DIAGNOSIS — Z720 Tobacco use: Secondary | ICD-10-CM

## 2017-07-27 HISTORY — DX: Non-pressure chronic ulcer of skin of other sites with unspecified severity: L98.499

## 2017-07-27 LAB — AEROBIC CULTURE W GRAM STAIN (SUPERFICIAL SPECIMEN)

## 2017-07-27 LAB — AEROBIC CULTURE  (SUPERFICIAL SPECIMEN): SPECIAL REQUESTS: NORMAL

## 2017-07-27 MED ORDER — OXYCODONE HCL 5 MG PO TABS: 5.0000 mg | ORAL_TABLET | Freq: Four times a day (QID) | ORAL | 0 refills | Status: DC | PRN

## 2017-07-27 MED ORDER — NICOTINE 21 MG/24HR TD PT24: 21.0000 mg | MEDICATED_PATCH | Freq: Every day | TRANSDERMAL | 0 refills | Status: DC | PRN

## 2017-07-27 MED ORDER — AMOXICILLIN-POT CLAVULANATE 875-125 MG PO TABS: 1.0000 | ORAL_TABLET | Freq: Two times a day (BID) | ORAL | 0 refills | Status: AC

## 2017-07-27 NOTE — Progress Notes (Signed)
[facility] Progress Note     Subjective: No complaints or issues. Doing well. Feels good.   Objective: Vital signs in last 24 hours: Temp:  [97.6 F (36.4 C)-98.5 F (36.9 C)] 98.5 F (36.9 C) (12/08 0602) Pulse Rate:  [84-91] 84 (12/08 0602) Resp:  [18] 18 (12/08 0602) BP: (115-120)/(59-74) 120/68 (12/08 0602) SpO2:  [95 %-96 %] 95 % (12/08 0602) Last BM Date: 07/24/17  Intake/Output from previous day: 12/07 0701 - 12/08 0700 In: 1760 [P.O.:960; IV Piggyback:800] Out: -  Intake/Output this shift: Total I/O In: 600 [P.O.:600] Out: -   General appearance: alert, cooperative and no distress Resp: normal work breathing Breasts: normal appearance, no masses or tenderness, right breast less swollen and indurated, decreased cellulitis, ulcer is improving with minimal fibrinous material  Lab Results:  Recent Labs    07/25/17 0428 07/26/17 0551  WBC 11.0* 9.7  HGB 14.5 14.3  HCT 45.3 44.7  PLT 404* 403*   BMET Recent Labs    07/25/17 0428 07/26/17 0551  NA 137 135  K 3.8 3.6  CL 102 100*  CO2 27 27  GLUCOSE 97 102*  BUN 10 7  CREATININE 0.58 0.49  CALCIUM 8.9 9.2    Anti-infectives: Anti-infectives (From admission, onward)   Start     Dose/Rate Route Frequency Ordered Stop   07/27/17 0000  amoxicillin-clavulanate (AUGMENTIN) 875-125 MG tablet     1 tablet Oral 2 times daily 07/27/17 1056 08/03/17 2359   07/24/17 1600  vancomycin (VANCOCIN) IVPB 1000 mg/200 mL premix     1,000 mg 200 mL/hr over 60 Minutes Intravenous Every 8 hours 07/24/17 0749     07/24/17 0900  piperacillin-tazobactam (ZOSYN) IVPB 3.375 g  Status:  Discontinued     3.375 g 12.5 mL/hr over 240 Minutes Intravenous Every 8 hours 07/24/17 0745 07/24/17 1422   07/24/17 0800  vancomycin (VANCOCIN) IVPB 1000 mg/200 mL premix     1,000 mg 200 mL/hr over 60 Minutes Intravenous Every 1 hr x 2 07/24/17 0749 07/24/17 1142   07/24/17 0700  vancomycin (VANCOCIN) 2,000 mg in sodium  chloride 0.9 % 500 mL IVPB  Status:  Discontinued     2,000 mg 250 mL/hr over 120 Minutes Intravenous  Once 07/24/17 0156 07/24/17 0749   07/24/17 0245  piperacillin-tazobactam (ZOSYN) IVPB 3.375 g     3.375 g 12.5 mL/hr over 240 Minutes Intravenous  Once 07/24/17 0240 07/24/17 0851   07/24/17 0230  piperacillin-tazobactam (ZOSYN) IVPB 3.375 g  Status:  Discontinued     3.375 g 100 mL/hr over 30 Minutes Intravenous  Once 07/24/17 0156 07/24/17 0242   07/23/17 2215  ceFAZolin (ANCEF) IVPB 1 g/50 mL premix     1 g 100 mL/hr over 30 Minutes Intravenous  Once 07/23/17 2204 07/23/17 2311      Assessment/Plan: Andrea Watts is a 46 yo femalewith a right breast infection presumed to be from a brown recluse spider bite.  -Home with antibiotics  -Continue BID santyl with dampened saline gauze to ulceration, will go home with this dressing plan    -Follow up with me Thursday 08/01/2017 or Tuesday 08/06/2017     LOS: 3 days    Andrea Watts 07/27/2017

## 2017-07-27 NOTE — Discharge Summary (Signed)
Physician Discharge Summary  Andrea Watts ZOX:[id number] DOB: 1971/06/07 DOA: 07/23/2017  PCP: Kirstie Peri, MD  Admit date: 07/23/2017 Discharge date: 07/27/2017  Admitted From: Home Disposition:  Home  Recommendations for Outpatient Follow-up:  1. Follow up with PCP in 1-2 weeks 2. Follow-up with Dr. Henreitta Leber on 12/13 3. Patient  will be discharged on oral Augmentin to complete 10 day course of oral antibiotics on 12/15.  Home Health:None Equipment/Devices:None  Discharge Condition:Fair CODE STATUS:Full code Diet recommendation: Regular    Discharge Diagnoses:  Principal Problem:   Cellulitis of right breast  Active Problems:     Skin ulcer of female breast   Depression   Anxiety   Tobacco use   ADD (attention deficit disorder)   Breast nodule   Brief narrative/history of present illness Please refer to admission H&P for details, in brief,46 year old obese female with history of anxiety, depression, ADD,Presented to the ED with progressive redness , Pain and ulcer over her right breast. Symptoms started as a small bump 2 days back She went to [facility] to him and was doxycycline for cellulitis and discharged home. However Patient started developing ulceration around the area with surrounding Erythema, edema and purulent discharge. Also had associated chills but no fever. Patient admitted for Cellulitis and ulceration of the right breast.   Hospital course Principal Problem:   Cellulitis of right breast With ulceration No underlying abscess Or drainable collection noted on CT. Antibiotic narrowed to IV cefazolin. Wound culture growing Enterobacter Faecalis. Sensitive to ampicillin. Blood cultures negative. -Surgery consult appreciated. -Clinically improved. Will discharge on oral Augmentin to complete total ten-day course of antibiotics. Continue twice a day Santyl with dampened saline gauze to ulceration. -outpatient follow-up  With Dr Henreitta Leber on 12/13.   Active  Problems:  Anxiety and depression Continue home medications  Tobacco business counseled on cessation. Nicotine patch prescribed.    Family Communication  :Discussed with wife at bedside  Disposition Plan  : Home Consults  :  Surgery  Procedures  : CT of the breast       Discharge Instructions   Allergies as of 07/27/2017   No Known Allergies     Medication List    STOP taking these medications   doxycycline 100 MG tablet Commonly known as:  VIBRA-TABS     TAKE these medications   ALEVE 220 MG tablet Generic drug:  naproxen sodium Take 660 mg by mouth daily as needed (for pain).   ALPRAZolam 1 MG tablet Commonly known as:  XANAX Take 1 mg by mouth 2 (two) times daily as needed for anxiety.   amoxicillin-clavulanate 875-125 MG tablet Commonly known as:  AUGMENTIN Take 1 tablet by mouth 2 (two) times daily for 7 days.   amphetamine-dextroamphetamine 20 MG tablet Commonly known as:  ADDERALL Take 20 mg by mouth 3 (three) times daily.   citalopram 40 MG tablet Commonly known as:  CELEXA Take 40 mg by mouth daily.   nicotine 21 mg/24hr patch Commonly known as:  NICODERM CQ - dosed in mg/24 hours Place 1 patch (21 mg total) onto the skin daily as needed (For nicotine withdrawal symptoms.).   omeprazole 20 MG capsule Commonly known as:  PRILOSEC Take 20 mg by mouth daily.   oxyCODONE 5 MG immediate release tablet Commonly known as:  Oxy IR/ROXICODONE Take 1 tablet (5 mg total) by mouth every 6 (six) hours as needed for moderate pain.      Follow-up Information    Lucretia Roers, MD Follow  up on 08/01/2017.   Specialty:  General Surgery Contact information: [address] [place] [facility] [id number] [phone]        Kirstie PeriShah, Ashish, MD. Schedule an appointment as soon as possible for a visit.   Specialty:  Internal Medicine Contact information: [address]  [place] [address] [phone]          No Known  Allergies  Consultations:   Procedures/Studies: Ct Chest W Contrast  Result Date: 07/23/2017 CLINICAL DATA:  Painful right breast. EXAM: CT CHEST WITH CONTRAST TECHNIQUE: Multidetector CT imaging of the chest was performed during intravenous contrast administration. CONTRAST:  75mL ISOVUE-300 IOPAMIDOL (ISOVUE-300) INJECTION 61% COMPARISON:  Chest radiograph 04/17/2010 FINDINGS: Cardiovascular: No significant vascular findings. Normal heart size. No pericardial effusion. Mediastinum/Nodes: No enlarged mediastinal, hilar, or axillary lymph nodes. Thyroid gland, trachea, and esophagus demonstrate no significant findings. Lungs/Pleura: Lungs are clear. No pleural effusion or pneumothorax. Upper Abdomen: Marked hepatic steatosis. Otherwise no acute findings. Musculoskeletal: Diffuse right lateral breast skin edema. 10 mm nodule in the right breast upper outer quadrant, posterior depth, with uncertain significance. Mildly enlarged, possibly reactive lymph nodes in the right axilla. No acute or significant osseous findings. IMPRESSION: Diffuse right lateral breast skin edema. 10 mm nodule in the right breast upper outer quadrant, posterior depth. Mildly enlarged, possibly reactive lymph nodes in the right axilla. These findings could be further evaluate with nonemergent mammography and focused right breast ultrasound. Otherwise no evidence of acute abnormality within the thorax. Electronically Signed   By: Ted Mcalpineobrinka  Dimitrova M.D.   On: 07/23/2017 23:47       Subjective: Continues to feel better. Afebrile.  Discharge Exam: Vitals:   07/26/17 2130 07/27/17 0602  BP: (!) 115/59 120/68  Pulse: 86 84  Resp: 18 18  Temp: 97.6 F (36.4 C) 98.5 F (36.9 C)  SpO2: 95% 95%   Vitals:   07/26/17 0543 07/26/17 1500 07/26/17 2130 07/27/17 0602  BP: 120/66 119/74 (!) 115/59 120/68  Pulse: 72 91 86 84  Resp: 20 18 18 18   Temp: 98 F (36.7 C) 97.6 F (36.4 C) 97.6 F (36.4 C) 98.5 F (36.9 C)   TempSrc: Oral Oral Oral Oral  SpO2: 98% 96% 95% 95%  Weight:      Height:        Gen.: Not in distress   HEENT: Moist mucosa, supple neck Chest: Clear bilaterally, erythema with swelling of the right breast with 2.5 cm ulcer with minimal fibrinous discharge. Tender to pressure (erythema, swelling and tenderness improved) CVS: Normal S1 and S2, no murmurs GI: Soft, nondistended, nontender Musculoskeletal: Warm, no edema       The results of significant diagnostics from this hospitalization (including imaging, microbiology, ancillary and laboratory) are listed below for reference.     Microbiology: Recent Results (from the past 240 hour(s))  Wound or Superficial Culture     Status: None   Collection Time: 07/23/17 10:01 PM  Result Value Ref Range Status   Specimen Description BREAST  Final   Special Requests Normal  Final   Gram Stain   Final    RARE WBC PRESENT, PREDOMINANTLY PMN NO ORGANISMS SEEN Performed at [facility], [address] [place], [place] [address]    Culture FEW ENTEROCOCCUS FAECALIS  Final   Report Status 07/27/2017 FINAL  Final   Organism ID, Bacteria ENTEROCOCCUS FAECALIS  Final      Susceptibility   Enterococcus faecalis - MIC*    AMPICILLIN <=2 SENSITIVE Sensitive  VANCOMYCIN 1 SENSITIVE Sensitive     GENTAMICIN SYNERGY SENSITIVE Sensitive     * FEW ENTEROCOCCUS FAECALIS  Blood culture (routine x 2)     Status: None (Preliminary result)   Collection Time: 07/23/17 10:43 PM  Result Value Ref Range Status   Specimen Description BLOOD RIGHT HAND  Final   Special Requests   Final    BOTTLES DRAWN AEROBIC AND ANAEROBIC Blood Culture results may not be optimal due to an excessive volume of blood received in culture bottles   Culture NO GROWTH 4 DAYS  Final   Report Status PENDING  Incomplete  Blood culture (routine x 2)     Status: None (Preliminary result)   Collection Time: 07/23/17 10:49 PM  Result Value Ref Range Status   Specimen  Description BLOOD LEFT HAND  Final   Special Requests   Final    BOTTLES DRAWN AEROBIC AND ANAEROBIC Blood Culture adequate volume   Culture NO GROWTH 4 DAYS  Final   Report Status PENDING  Incomplete     Labs: BNP (last 3 results) No results for input(s): BNP in the last 8760 hours. Basic Metabolic Panel: Recent Labs  Lab 07/23/17 2107 07/24/17 0410 07/25/17 0428 07/26/17 0551  NA 136 136 137 135  K 3.7 3.7 3.8 3.6  CL 100* 102 102 100*  CO2 27 28 27 27   GLUCOSE 124* 102* 97 102*  BUN 10 11 10 7   CREATININE 0.65 0.52 0.58 0.49  CALCIUM 9.3 9.0 8.9 9.2   Liver Function Tests: No results for input(s): AST, ALT, ALKPHOS, BILITOT, PROT, ALBUMIN in the last 168 hours. No results for input(s): LIPASE, AMYLASE in the last 168 hours. No results for input(s): AMMONIA in the last 168 hours. CBC: Recent Labs  Lab 07/23/17 2107 07/24/17 0410 07/25/17 0428 07/26/17 0551  WBC 14.9* 12.3* 11.0* 9.7  NEUTROABS 9.8* 7.1 5.9 5.7  HGB 14.4 13.7 14.5 14.3  HCT 43.8 42.8 45.3 44.7  MCV 93.6 94.3 95.0 93.3  PLT 389 362 404* 403*   Cardiac Enzymes: No results for input(s): CKTOTAL, CKMB, CKMBINDEX, TROPONINI in the last 168 hours. BNP: Invalid input(s): POCBNP CBG: No results for input(s): GLUCAP in the last 168 hours. D-Dimer No results for input(s): DDIMER in the last 72 hours. Hgb A1c No results for input(s): HGBA1C in the last 72 hours. Lipid Profile No results for input(s): CHOL, HDL, LDLCALC, TRIG, CHOLHDL, LDLDIRECT in the last 72 hours. Thyroid function studies No results for input(s): TSH, T4TOTAL, T3FREE, THYROIDAB in the last 72 hours.  Invalid input(s): FREET3 Anemia work up No results for input(s): VITAMINB12, FOLATE, FERRITIN, TIBC, IRON, RETICCTPCT in the last 72 hours. Urinalysis    Component Value Date/Time   COLORURINE YELLOW 04/17/2010 1930   APPEARANCEUR CLEAR 04/17/2010 1930   LABSPEC 1.010 04/17/2010 1930   PHURINE 5.5 04/17/2010 1930   GLUCOSEU  NEGATIVE 04/17/2010 1930   HGBUR MODERATE (A) 04/17/2010 1930   BILIRUBINUR NEGATIVE 04/17/2010 1930   KETONESUR NEGATIVE 04/17/2010 1930   PROTEINUR NEGATIVE 04/17/2010 1930   UROBILINOGEN 0.2 04/17/2010 1930   NITRITE NEGATIVE 04/17/2010 1930   LEUKOCYTESUR NEGATIVE 04/17/2010 1930   Sepsis Labs Invalid input(s): PROCALCITONIN,  WBC,  LACTICIDVEN Microbiology Recent Results (from the past 240 hour(s))  Wound or Superficial Culture     Status: None   Collection Time: 07/23/17 10:01 PM  Result Value Ref Range Status   Specimen Description BREAST  Final   Special Requests Normal  Final   Gram  Stain   Final    RARE WBC PRESENT, PREDOMINANTLY PMN NO ORGANISMS SEEN Performed at [facility], [address] [place], [place] [address]    Culture FEW ENTEROCOCCUS FAECALIS  Final   Report Status 07/27/2017 FINAL  Final   Organism ID, Bacteria ENTEROCOCCUS FAECALIS  Final      Susceptibility   Enterococcus faecalis - MIC*    AMPICILLIN <=2 SENSITIVE Sensitive     VANCOMYCIN 1 SENSITIVE Sensitive     GENTAMICIN SYNERGY SENSITIVE Sensitive     * FEW ENTEROCOCCUS FAECALIS  Blood culture (routine x 2)     Status: None (Preliminary result)   Collection Time: 07/23/17 10:43 PM  Result Value Ref Range Status   Specimen Description BLOOD RIGHT HAND  Final   Special Requests   Final    BOTTLES DRAWN AEROBIC AND ANAEROBIC Blood Culture results may not be optimal due to an excessive volume of blood received in culture bottles   Culture NO GROWTH 4 DAYS  Final   Report Status PENDING  Incomplete  Blood culture (routine x 2)     Status: None (Preliminary result)   Collection Time: 07/23/17 10:49 PM  Result Value Ref Range Status   Specimen Description BLOOD LEFT HAND  Final   Special Requests   Final    BOTTLES DRAWN AEROBIC AND ANAEROBIC Blood Culture adequate volume   Culture NO GROWTH 4 DAYS  Final   Report Status PENDING  Incomplete     Time coordinating discharge: < 30  minutes  SIGNED:   Eddie North, MD  [facility] 07/27/2017, 10:57 AM Pager   If 7PM-7AM, please contact night-coverage [email or web address] Password [email or web address]

## 2017-07-27 NOTE — Discharge Instructions (Signed)
-  Continue  santyl with dampened saline gauze to ulceration twice daily -Take your oral antibiotics -Ok to shower and place dressing following shower, pat area dry

## 2017-07-27 NOTE — Progress Notes (Signed)
Discharge instructions and prescriptions given, verbalized understanding, out in stable condition ambulatory. 

## 2017-07-28 LAB — CULTURE, BLOOD (ROUTINE X 2)
CULTURE: NO GROWTH
Culture: NO GROWTH
SPECIAL REQUESTS: ADEQUATE

## 2020-03-13 ENCOUNTER — Emergency Department (HOSPITAL_COMMUNITY)
Admission: EM | Admit: 2020-03-13 | Discharge: 2020-03-13 | Disposition: A | Discharge status: Home / Self Care | Payer: Self-pay | Attending: Emergency Medicine | Admitting: Emergency Medicine

## 2020-03-13 ENCOUNTER — Encounter (HOSPITAL_COMMUNITY): Payer: Self-pay | Admitting: Emergency Medicine

## 2020-03-13 ENCOUNTER — Other Ambulatory Visit: Payer: Self-pay

## 2020-03-13 DIAGNOSIS — R2241 Localized swelling, mass and lump, right lower limb: Secondary | ICD-10-CM | POA: Insufficient documentation

## 2020-03-13 DIAGNOSIS — F17210 Nicotine dependence, cigarettes, uncomplicated: Secondary | ICD-10-CM | POA: Insufficient documentation

## 2020-03-13 DIAGNOSIS — R21 Rash and other nonspecific skin eruption: Secondary | ICD-10-CM | POA: Insufficient documentation

## 2020-03-13 DIAGNOSIS — M25561 Pain in right knee: Secondary | ICD-10-CM | POA: Insufficient documentation

## 2020-03-13 NOTE — ED Triage Notes (Signed)
Patient c/o right leg pain x2 months that is progressively getting worse. Patient states pain started in knee. Denies any injury. Patient states worse with ambulation. Patient taking advil with no relief, last dose at 5am.

## 2020-03-13 NOTE — Discharge Instructions (Addendum)
Please make an appointment with orthopedics to evaluate your knee Rest - please stay off right knee as much as possible Ice - ice for 20 minutes at a time, several times a day Compression - wear brace to provide support Elevate - elevate right leg above level of heart if there is any swelling Ibuprofen - take with food. Take up to 3-4 times daily

## 2020-03-13 NOTE — ED Provider Notes (Signed)
[facility] Provider Note   CSN: [id number] Arrival date & time: 03/13/20  1007     History Chief Complaint  Patient presents with  . Claudication    Andrea Watts is a 49 y.o. female who presents with R knee pain.  Patient states that she has been having right knee pain for approximately 2 months.  She cannot recall specific injury but states that she has twisted it several times.  She states that it will intermittently get painful and swollen and the pain will radiate from the front of her knee to around the back of her knee.  Ambulation makes it worse as well as bending the knee.  Rest, using a brace, NSAIDs make it better.  She has tried to rehab the knee on her own with exercises with no improvement. Couple weeks ago she felt a pop in her knee and the pain actually got better and she thought maybe it was dislocated and then went back into place however several days later she started to have severe pain again.  She also has some skin discoloration but states that her skin has looked like this for longer than her knee has been hurting.  Multiple people said she needed to come to the emergency department to get evaluated for cellulitis.  She denies any fever, chills, nausea or vomiting.  HPI     Past Medical History:  Diagnosis Date  . ADD (attention deficit disorder)   . Anxiety   . Depression     Patient Active Problem List   Diagnosis Date Noted  . Skin ulcer of female breast 07/27/2017  . Cellulitis of right breast 07/24/2017  . Depression 07/24/2017  . Anxiety 07/24/2017  . Tobacco use 07/24/2017  . ADD (attention deficit disorder) 07/24/2017  . Breast nodule 07/24/2017    History reviewed. No pertinent surgical history.   OB History    Gravida  0   Para  0   Term  0   Preterm  0   AB  0   Living  0     SAB  0   TAB  0   Ectopic  0   Multiple  0   Live Births  0           Family History  Problem Relation Age of Onset   . Lung cancer Father        Exposure to asbestos. Mesothelioma?  Marland Kitchen Alcoholism Father     Social History   Tobacco Use  . Smoking status: Current Every Day Smoker    Packs/day: 0.50    Types: Cigarettes  . Smokeless tobacco: Never Used  Vaping Use  . Vaping Use: Never used  Substance Use Topics  . Alcohol use: No  . Drug use: No    Home Medications Prior to Admission medications   Medication Sig Start Date End Date Taking? Authorizing Provider  ALPRAZolam Prudy Feeler) 1 MG tablet Take 1 mg by mouth 2 (two) times daily as needed for anxiety.    [provider]  amphetamine-dextroamphetamine (ADDERALL) 20 MG tablet Take 20 mg by mouth 3 (three) times daily. 07/09/17   [provider]  citalopram (CELEXA) 40 MG tablet Take 40 mg by mouth daily.    [provider]  naproxen sodium (ALEVE) 220 MG tablet Take 660 mg by mouth daily as needed (for pain).    [provider]  nicotine (NICODERM CQ - DOSED IN MG/24 HOURS) 21 mg/24hr patch Place  1 patch (21 mg total) onto the skin daily as needed (For nicotine withdrawal symptoms.). 07/27/17   Dhungel, Theda Belfast, MD  omeprazole (PRILOSEC) 20 MG capsule Take 20 mg by mouth daily.    [provider]  oxyCODONE (OXY IR/ROXICODONE) 5 MG immediate release tablet Take 1 tablet (5 mg total) by mouth every 6 (six) hours as needed for moderate pain. 07/27/17   Dhungel, Theda Belfast, MD    Allergies    Patient has no known allergies.  Review of Systems   Review of Systems  Constitutional: Negative for chills and fever.  Musculoskeletal: Positive for arthralgias and joint swelling.  Skin: Positive for rash.  All other systems reviewed and are negative.   Physical Exam Updated Vital Signs BP (!) 152/101 (BP Location: Right Wrist)   Pulse 93   Temp 98.1 F (36.7 C) (Oral)   Resp 16   Ht 5\' 7"  (1.702 m)   Wt (!) 113.4 kg   LMP 07/07/2017   SpO2 98%   BMI 39.16 kg/m   Physical Exam Vitals and nursing note  reviewed.  Constitutional:      General: She is not in acute distress.    Appearance: She is well-developed. She is obese. She is not ill-appearing.     Comments: Calm, cooperative. NAD  HENT:     Head: Normocephalic and atraumatic.  Eyes:     General: No scleral icterus.       Right eye: No discharge.        Left eye: No discharge.     Conjunctiva/sclera: Conjunctivae normal.     Pupils: Pupils are equal, round, and reactive to light.  Cardiovascular:     Rate and Rhythm: Normal rate.  Pulmonary:     Effort: Pulmonary effort is normal. No respiratory distress.  Abdominal:     General: There is no distension.  Musculoskeletal:     Cervical back: Normal range of motion.     Comments: Right knee: Mild diffuse swelling. No deformity, redness, or warmth. There is a rash on the bilateral lower extremities which looks similar to rosacea. There is no tenderness of the knee or leg. Pain is elicited with ROM of the knee. 5/5 strength. Cap refill <2. N/V intact.   Skin:    General: Skin is warm and dry.  Neurological:     Mental Status: She is alert and oriented to person, place, and time.  Psychiatric:        Behavior: Behavior normal.     ED Results / Procedures / Treatments   Labs (all labs ordered are listed, but only abnormal results are displayed) Labs Reviewed - No data to display  EKG None  Radiology No results found.  Procedures Procedures (including critical care time)  Medications Ordered in ED Medications - No data to display  ED Course  I have reviewed the triage vital signs and the nursing notes.  Pertinent labs & imaging results that were available during my care of the patient were reviewed by me and considered in my medical decision making (see chart for details).  49 year old female presents with right knee pain that has been ongoing for the past several months.  Pain seems to be coming and going and is associated with swelling and stiffness.  She did not  have a injury but does report she has twisted it several times.  Patient has mechanical symptoms with popping and subsequent swelling.  She does have a chronic appearing rash on the bilateral lower extremities.  She is neurovascularly intact.  I have a very low suspicion for any infectious cause with no fever or systemic symptoms.  Also low suspicion for DVT with bilateral rash and chronic symptoms.  Suspect she has a soft tissue injury such as a meniscus tear.  She is advised to continue rice protocol, NSAIDs and to make an appointment with orthopedics for possible MRI.  MDM Rules/Calculators/A&P                           Final Clinical Impression(s) / ED Diagnoses Final diagnoses:  Acute pain of right knee    Rx / DC Orders ED Discharge Orders    None       Bethel Born, PA-C 03/13/20 1149    Bethann Berkshire, MD 03/13/20 [phone]

## 2020-09-14 ENCOUNTER — Ambulatory Visit: Payer: Medicare Other | Admitting: Internal Medicine

## 2020-09-19 ENCOUNTER — Ambulatory Visit: Payer: Medicare Other | Admitting: Internal Medicine

## 2020-09-27 ENCOUNTER — Encounter: Payer: Self-pay | Admitting: Internal Medicine

## 2020-09-27 ENCOUNTER — Ambulatory Visit (INDEPENDENT_AMBULATORY_CARE_PROVIDER_SITE_OTHER): Payer: 59 | Admitting: Internal Medicine

## 2020-09-27 ENCOUNTER — Other Ambulatory Visit: Payer: Self-pay

## 2020-09-27 VITALS — BP 137/90 | HR 97 | Temp 97.9°F | Resp 18 | Ht 67.0 in | Wt 295.4 lb

## 2020-09-27 DIAGNOSIS — Z124 Encounter for screening for malignant neoplasm of cervix: Secondary | ICD-10-CM

## 2020-09-27 DIAGNOSIS — F988 Other specified behavioral and emotional disorders with onset usually occurring in childhood and adolescence: Secondary | ICD-10-CM | POA: Diagnosis not present

## 2020-09-27 DIAGNOSIS — Z720 Tobacco use: Secondary | ICD-10-CM

## 2020-09-27 DIAGNOSIS — M7989 Other specified soft tissue disorders: Secondary | ICD-10-CM

## 2020-09-27 DIAGNOSIS — K219 Gastro-esophageal reflux disease without esophagitis: Secondary | ICD-10-CM

## 2020-09-27 DIAGNOSIS — F319 Bipolar disorder, unspecified: Secondary | ICD-10-CM | POA: Diagnosis not present

## 2020-09-27 DIAGNOSIS — M1710 Unilateral primary osteoarthritis, unspecified knee: Secondary | ICD-10-CM

## 2020-09-27 DIAGNOSIS — Z23 Encounter for immunization: Secondary | ICD-10-CM

## 2020-09-27 DIAGNOSIS — Z7689 Persons encountering health services in other specified circumstances: Secondary | ICD-10-CM

## 2020-09-27 NOTE — Assessment & Plan Note (Signed)
On Omeprazole 

## 2020-09-27 NOTE — Assessment & Plan Note (Signed)
Appears to be related to chronic venous insufficiency Advised to keep legs elevated when possible Compression stockings

## 2020-09-27 NOTE — Assessment & Plan Note (Signed)
On Lamictal and tapering dose of Celexa

## 2020-09-27 NOTE — Assessment & Plan Note (Signed)
Had intraarticular steroid recently in Orthopedic urgent care Referred to Orthopedic surgery for further evaluation Weight loss would improve pain and inflammation overall.

## 2020-09-27 NOTE — Assessment & Plan Note (Signed)
Smokes about 1 pack/day  Asked about quitting: confirms that she currently smokes cigarettes Advise to quit smoking: Educated about QUITTING to reduce the risk of cancer, cardio and cerebrovascular disease. Assess willingness: Unwilling to quit at this time, but is working on cutting back. Assist with counseling and pharmacotherapy: Counseled for 5 minutes and literature provided. Arrange for follow up: Follow up in 3 months and continue to offer help. 

## 2020-09-27 NOTE — Progress Notes (Signed)
New Patient Office Visit  Subjective:  Patient ID: Andrea Watts, female    DOB: [birth year]  Age: 50 y.o. MRN: [id number]  CC:  Chief Complaint  Patient presents with  . New Patient (Initial Visit)    New patient has had fluid on both knees pt was saw at [facility] for this however they told her to see the primary dr     HPI Andrea Watts is a 50 year old female with PMH of bipolar disorder, ADD, chronic opioid use in the past -currently on Subutex, GERD, knee arthritis and morbid obesity who presents for establishing care.  She went to Orthopedic urgent care for knee pain and swelling and was given intraarticular steroid injection. She reports a h/o knee arthritis. She has been frustrated with knee pain as she is not able to walk outside because of it. She also reports having LE swelling b/l, but denies any orthopnea or PND. She denies any chest pain or palpitations.  She has a h/o bipolar disorder and ADD, for which she follows up with Psychiatrist. She is on Lamictal, Xanax and Adderall.  She is on Subutex and follows up with Addiction specialist for h/o chronic opioid use.  She has had 2 doses of COVID vaccine. She received flu vaccine in the office today.    Past Medical History:  Diagnosis Date  . ADD (attention deficit disorder)   . Anxiety   . Arthritis    Phreesia 09/13/2020  . Depression   . Depression    Phreesia 09/13/2020  . Skin ulcer of female breast ([place]) 07/27/2017    History reviewed. No pertinent surgical history.  Family History  Problem Relation Age of Onset  . Lung cancer Father        Exposure to asbestos. Mesothelioma?  Marland Kitchen Alcoholism Father     Social History   Socioeconomic History  . Marital status: Married    Spouse name: Not on file  . Number of children: Not on file  . Years of education: Not on file  . Highest education level: Not on file  Occupational History  . Not on file  Tobacco Use  . Smoking status: Current Every  Day Smoker    Packs/day: 0.50    Types: Cigarettes  . Smokeless tobacco: Never Used  Vaping Use  . Vaping Use: Never used  Substance and Sexual Activity  . Alcohol use: No  . Drug use: No  . Sexual activity: Yes    Birth control/protection: None  Other Topics Concern  . Not on file  Social History Narrative  . Not on file   Social Determinants of Health   Financial Resource Strain: Not on file  Food Insecurity: Not on file  Transportation Needs: Not on file  Physical Activity: Not on file  Stress: Not on file  Social Connections: Not on file  Intimate Partner Violence: Not on file    ROS Review of Systems  Constitutional: Negative for chills and fever.  HENT: Negative for congestion, sinus pressure, sinus pain and sore throat.   Eyes: Negative for pain and discharge.  Respiratory: Negative for cough and shortness of breath.   Cardiovascular: Negative for chest pain and palpitations.  Gastrointestinal: Negative for abdominal pain, constipation, diarrhea, nausea and vomiting.  Endocrine: Negative for polydipsia and polyuria.  Genitourinary: Negative for dysuria and hematuria.  Musculoskeletal: Positive for arthralgias and back pain. Negative for neck pain and neck stiffness.  Skin: Negative for rash.  Neurological: Negative for dizziness and  weakness.  Psychiatric/Behavioral: Negative for agitation, behavioral problems and suicidal ideas. The patient is nervous/anxious.     Objective:   Today's Vitals: BP 137/90 (BP Location: Right Arm, Patient Position: Sitting, Cuff Size: Normal)   Pulse 97   Temp 97.9 F (36.6 C) (Oral)   Resp 18   Ht 5' 7"  (1.702 m)   Wt 295 lb 6.4 oz (134 kg)   LMP 07/07/2017   SpO2 97%   BMI 46.27 kg/m   Physical Exam Vitals reviewed.  Constitutional:      General: She is not in acute distress.    Appearance: She is not diaphoretic.  HENT:     Head: Normocephalic and atraumatic.     Nose: Nose normal.     Mouth/Throat:     Mouth:  Mucous membranes are moist.  Eyes:     General: No scleral icterus.    Extraocular Movements: Extraocular movements intact.     Pupils: Pupils are equal, round, and reactive to light.  Cardiovascular:     Rate and Rhythm: Normal rate and regular rhythm.     Pulses: Normal pulses.     Heart sounds: No murmur heard.   Pulmonary:     Breath sounds: Normal breath sounds. No wheezing or rales.  Abdominal:     Palpations: Abdomen is soft.     Tenderness: There is no abdominal tenderness.  Musculoskeletal:     Cervical back: Neck supple. No tenderness.     Right lower leg: Edema (Trace) present.     Left lower leg: Edema (1+) present.  Skin:    General: Skin is warm.     Findings: No rash.     Comments: Chronic erythematous skin changes over b/l LE  Neurological:     General: No focal deficit present.     Mental Status: She is alert and oriented to person, place, and time.     Sensory: No sensory deficit.     Motor: No weakness.  Psychiatric:        Mood and Affect: Mood normal.        Behavior: Behavior normal.     Assessment & Plan:   Problem List Items Addressed This Visit      Encounter to establish care - Primary   Care established Previous chart reviewed History and medications reviewed with the patient     Relevant Orders  CBC  CMP14+EGFR  Hemoglobin A1c  Lipid Profile  TSH  Vitamin D (25 hydroxy)    Digestive   GERD (gastroesophageal reflux disease)    On Omeprazole        Musculoskeletal and Integument   Arthritis of knee    Had intraarticular steroid recently in Orthopedic urgent care Referred to Orthopedic surgery for further evaluation Weight loss would improve pain and inflammation overall.      Relevant Medications   buprenorphine (SUBUTEX) 8 MG SUBL SL tablet   Other Relevant Orders   Ambulatory referral to Orthopedic Surgery     Other   Tobacco use    Smokes about 1 pack/day  Asked about quitting: confirms that she currently smokes  cigarettes Advise to quit smoking: Educated about QUITTING to reduce the risk of cancer, cardio and cerebrovascular disease. Assess willingness: Unwilling to quit at this time, but is working on cutting back. Assist with counseling and pharmacotherapy: Counseled for 5 minutes and literature provided. Arrange for follow up: Follow up in 3 months and continue to offer help.  ADD (attention deficit disorder)    On Adderall Follows up with Psychiatrist      Bipolar disorder ([place])    On Lamictal and tapering dose of Celexa         Need for immunization against influenza    Patient was educated on the recommendation for flu vaccine. After obtaining informed consent, the vaccine was administered no adverse effects noted at time of administration. Patient provided with education on arm soreness and use of tylenol or ibuprofen (if safe) for this. Encourage to use the arm vaccine was given in to help reduce the soreness. Patient educated on the signs of a reaction to the vaccine and advised to contact the office should these occur.      Relevant Orders   Flu Vaccine QUAD 36+ mos IM (Completed)   Leg swelling    Appears to be related to chronic venous insufficiency Advised to keep legs elevated when possible Compression stockings      Morbid obesity (HCC)    Diet modification and moderate exercise/walking as tolerated       Other Visit Diagnoses    Routine cervical smear       Relevant Orders   Ambulatory referral to Obstetrics / Gynecology      Outpatient Encounter Medications as of 09/27/2020  Medication Sig  . ALPRAZolam (XANAX) 1 MG tablet Take 1 mg by mouth 2 (two) times daily as needed for anxiety.  Marland Kitchen amphetamine-dextroamphetamine (ADDERALL) 20 MG tablet Take 20 mg by mouth 3 (three) times daily.  . buprenorphine (SUBUTEX) 8 MG SUBL SL tablet Place under the tongue.  . lamoTRIgine (LAMICTAL) 25 MG tablet Take by mouth.  Marland Kitchen omeprazole (PRILOSEC) 20 MG capsule Take 20 mg by  mouth daily.  . [DISCONTINUED] citalopram (CELEXA) 40 MG tablet Take 40 mg by mouth daily.  . [DISCONTINUED] naproxen sodium (ALEVE) 220 MG tablet Take 660 mg by mouth daily as needed (for pain).  . [DISCONTINUED] nicotine (NICODERM CQ - DOSED IN MG/24 HOURS) 21 mg/24hr patch Place 1 patch (21 mg total) onto the skin daily as needed (For nicotine withdrawal symptoms.).  . [DISCONTINUED] oxyCODONE (OXY IR/ROXICODONE) 5 MG immediate release tablet Take 1 tablet (5 mg total) by mouth every 6 (six) hours as needed for moderate pain.   No facility-administered encounter medications on file as of 09/27/2020.    Follow-up: Return in about 4 months (around 01/25/2021).   Lindell Spar, MD

## 2020-09-27 NOTE — Assessment & Plan Note (Signed)

## 2020-09-27 NOTE — Assessment & Plan Note (Signed)
On Adderall Follows up with Psychiatrist 

## 2020-09-27 NOTE — Patient Instructions (Signed)
Please continue to take medications as prescribed.  Please follow low carbohydrate and low salt diet.  Please fasting blood tests done before the next visit.  Please apply compression stockings for leg swelling. Please try to keep legs elevated at nighttime. Avoid prolonged standing.

## 2020-09-27 NOTE — Assessment & Plan Note (Signed)
Care established Previous chart reviewed History and medications reviewed with the patient 

## 2020-09-27 NOTE — Assessment & Plan Note (Signed)
Diet modification and moderate exercise/walking as tolerated 

## 2020-10-04 ENCOUNTER — Ambulatory Visit: Payer: Medicare Other | Admitting: Internal Medicine

## 2020-10-05 ENCOUNTER — Encounter: Payer: Self-pay | Admitting: Orthopaedic Surgery

## 2020-10-10 ENCOUNTER — Telehealth (INDEPENDENT_AMBULATORY_CARE_PROVIDER_SITE_OTHER): Payer: Self-pay

## 2020-10-12 ENCOUNTER — Ambulatory Visit (INDEPENDENT_AMBULATORY_CARE_PROVIDER_SITE_OTHER): Payer: Medicare Other | Admitting: Internal Medicine

## 2020-11-15 ENCOUNTER — Other Ambulatory Visit (INDEPENDENT_AMBULATORY_CARE_PROVIDER_SITE_OTHER): Payer: Self-pay | Admitting: Internal Medicine

## 2020-11-15 ENCOUNTER — Ambulatory Visit (INDEPENDENT_AMBULATORY_CARE_PROVIDER_SITE_OTHER): Payer: 59 | Admitting: Internal Medicine

## 2020-11-22 ENCOUNTER — Telehealth (INDEPENDENT_AMBULATORY_CARE_PROVIDER_SITE_OTHER): Payer: Self-pay

## 2020-11-22 ENCOUNTER — Other Ambulatory Visit (INDEPENDENT_AMBULATORY_CARE_PROVIDER_SITE_OTHER): Payer: Self-pay | Admitting: Internal Medicine

## 2020-11-22 NOTE — Telephone Encounter (Signed)
Pt called to inquire about "canceling her new pt appointment". Pt said she wanted to know why we canceled? I look patient up and explain the [facility] policy of new patient appointments. Stated she was scheduled, at to PCP in [facility] office. Being that she seen est care with Dr Allena Katz. Pt was to continue at this providers office. Secondly, that being the list of medications you are currently on currently. Such as opioids, pain medications  & see pain management that we would not be able to accept as new patient. The only patients accepted are patient for Cancer/ Hospice Care patients on these type of medications.  Pt said she feels like we did a HIPPA violation stating this on her mobile phone message. I stated to patient this number is what she states to leave messages. I asked is this not correct? She said yes, but all my family cam here it. I stated if it is a mobile phone, it is a Radio producer. The only what it can be broken privacy act if she "allow" them( family or etc.) to listen or check her voicemail. She then said well you stated what I wanted to know. The cancel appointment reason. Because the Dr.Patel did not address her HTN, other needs. I ask her to call Dr. Allena Katz office to set appt to go over her concerns. Thank you.

## 2020-11-22 NOTE — Telephone Encounter (Signed)
Thanks, oh well.

## 2020-11-28 ENCOUNTER — Ambulatory Visit: Payer: 59 | Admitting: Internal Medicine

## 2020-12-12 ENCOUNTER — Encounter: Payer: Self-pay | Admitting: Internal Medicine

## 2020-12-12 ENCOUNTER — Telehealth: Payer: Self-pay | Admitting: Orthopaedic Surgery

## 2020-12-12 ENCOUNTER — Other Ambulatory Visit: Payer: Self-pay

## 2020-12-12 ENCOUNTER — Ambulatory Visit (INDEPENDENT_AMBULATORY_CARE_PROVIDER_SITE_OTHER): Payer: 59 | Admitting: Internal Medicine

## 2020-12-12 VITALS — BP 116/77 | HR 101 | Resp 18 | Ht 67.0 in | Wt 293.1 lb

## 2020-12-12 DIAGNOSIS — L309 Dermatitis, unspecified: Secondary | ICD-10-CM | POA: Diagnosis not present

## 2020-12-12 DIAGNOSIS — F988 Other specified behavioral and emotional disorders with onset usually occurring in childhood and adolescence: Secondary | ICD-10-CM | POA: Diagnosis not present

## 2020-12-12 DIAGNOSIS — Z1159 Encounter for screening for other viral diseases: Secondary | ICD-10-CM | POA: Diagnosis not present

## 2020-12-12 DIAGNOSIS — M1710 Unilateral primary osteoarthritis, unspecified knee: Secondary | ICD-10-CM | POA: Diagnosis not present

## 2020-12-12 DIAGNOSIS — F1120 Opioid dependence, uncomplicated: Secondary | ICD-10-CM | POA: Insufficient documentation

## 2020-12-12 MED ORDER — TRIAMCINOLONE ACETONIDE 0.1 % EX CREA: 1.0000 "application " | TOPICAL_CREAM | Freq: Two times a day (BID) | CUTANEOUS | 0 refills | Status: DC

## 2020-12-12 NOTE — Telephone Encounter (Signed)
Called patient in response to call this afternoon to relay that CD from [facility] has been received. No answer or voice mail. Appointment pending records and review by provider.

## 2020-12-12 NOTE — Assessment & Plan Note (Signed)
On Adderall Follows up with Psychiatrist 

## 2020-12-12 NOTE — Progress Notes (Signed)
Established Patient Office Visit  Subjective:  Patient ID: Andrea Watts, female    DOB: [birth year]  Age: 50 y.o. MRN: [id number]  CC:  Chief Complaint  Patient presents with  . Hypertension    Pt in for blood pressure check and check on bilateral leg pain. 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  Her blood pressure was wnl today. Patient denies headache, dizziness, chest pain, dyspnea or palpitations.  She has not been able to see Orthopedic surgeon for knee pain yet due to delay in transfer of records from previous Orthopedic surgeon.  She c/o rash on left hand for about a month, which has been itching intermittently. She has been using Benadryl with minimal help.  She has a h/o bipolar disorder and ADD, for which she follows up with Psychiatrist. She is on Xanax and Adderall.  She is on Subutex and follows up with Addiction specialist for h/o chronic opioid use.   Past Medical History:  Diagnosis Date  . ADD (attention deficit disorder)   . Anxiety   . Arthritis    Phreesia 09/13/2020  . Depression   . Depression    Phreesia 09/13/2020  . Skin ulcer of female breast (HCC) 07/27/2017    No past surgical history on file.  Family History  Problem Relation Age of Onset  . Lung cancer Father        Exposure to asbestos. Mesothelioma?  Marland Kitchen Alcoholism Father     Social History   Socioeconomic History  . Marital status: Married    Spouse name: Not on file  . Number of children: Not on file  . Years of education: Not on file  . Highest education level: Not on file  Occupational History  . Not on file  Tobacco Use  . Smoking status: Current Every Day Smoker    Packs/day: 0.50    Types: Cigarettes  . Smokeless tobacco: Never Used   Vaping Use  . Vaping Use: Never used  Substance and Sexual Activity  . Alcohol use: No  . Drug use: No  . Sexual activity: Yes    Birth control/protection: None  Other Topics Concern  . Not on file  Social History Narrative  . Not on file   Social Determinants of Health   Financial Resource Strain: Not on file  Food Insecurity: Not on file  Transportation Needs: Not on file  Physical Activity: Not on file  Stress: Not on file  Social Connections: Not on file  Intimate Partner Violence: Not on file    Outpatient Medications Prior to Visit  Medication Sig Dispense Refill  . ALPRAZolam (XANAX) 1 MG tablet Take 1 mg by mouth 2 (two) times daily as needed for anxiety.    Marland Kitchen amphetamine-dextroamphetamine (ADDERALL) 20 MG tablet Take 20 mg by mouth 3 (three) times daily.  0  . buprenorphine (SUBUTEX) 8 MG SUBL SL tablet Place under the tongue.    Marland Kitchen omeprazole (PRILOSEC) 20 MG capsule Take 20 mg by mouth daily.    Marland Kitchen lamoTRIgine (LAMICTAL) 25 MG tablet Take by mouth.     No facility-administered medications prior to visit.    No Known Allergies  ROS Review of Systems  Constitutional: Negative for chills and fever.  HENT: Negative for congestion, sinus pressure, sinus pain and sore throat.   Eyes: Negative for pain and discharge.  Respiratory: Negative for cough and shortness of breath.   Cardiovascular: Negative for chest pain and palpitations.  Gastrointestinal: Negative for abdominal pain, constipation, diarrhea, nausea and vomiting.  Endocrine: Negative for polydipsia and polyuria.  Genitourinary: Negative for dysuria and hematuria.  Musculoskeletal: Positive for arthralgias and back pain. Negative for neck pain and neck stiffness.  Skin: Negative for rash.  Neurological: Negative for dizziness and weakness.  Psychiatric/Behavioral: Negative for agitation, behavioral problems and suicidal ideas. The patient is nervous/anxious.       Objective:    Physical  Exam Vitals reviewed.  Constitutional:      General: She is not in acute distress.    Appearance: She is not diaphoretic.  HENT:     Head: Normocephalic and atraumatic.     Nose: Nose normal.     Mouth/Throat:     Mouth: Mucous membranes are moist.  Eyes:     General: No scleral icterus.    Extraocular Movements: Extraocular movements intact.     Pupils: Pupils are equal, round, and reactive to light.  Cardiovascular:     Rate and Rhythm: Normal rate and regular rhythm.     Pulses: Normal pulses.     Heart sounds: No murmur heard.   Pulmonary:     Breath sounds: Normal breath sounds. No wheezing or rales.  Abdominal:     Palpations: Abdomen is soft.     Tenderness: There is no abdominal tenderness.  Musculoskeletal:     Cervical back: Neck supple. No tenderness.     Right lower leg: Edema (Trace) present.     Left lower leg: Edema (1+) present.  Skin:    General: Skin is warm.     Findings: Rash (Erythematous macules over right hand, dorsal aspect) present.     Comments: Chronic erythematous skin changes over b/l LE  Neurological:     General: No focal deficit present.     Mental Status: She is alert and oriented to person, place, and time.     Sensory: No sensory deficit.     Motor: No weakness.  Psychiatric:        Mood and Affect: Mood normal.        Behavior: Behavior normal.     BP 116/77 (BP Location: Right Arm, Patient Position: Sitting, Cuff Size: Normal)   Pulse (!) 101   Resp 18   Ht 5\' 7"  (1.702 m)   Wt 293 lb 1.9 oz (133 kg)   LMP 07/07/2017   SpO2 96%   BMI 45.91 kg/m  Wt Readings from Last 3 Encounters:  12/12/20 293 lb 1.9 oz (133 kg)  09/27/20 295 lb 6.4 oz (134 kg)  03/13/20 (!) 250 lb (113.4 kg)     Health Maintenance Due  Topic Date Due  . Hepatitis C Screening  Never done  . COVID-19 Vaccine (1) Never done  . TETANUS/TDAP  Never done  . PAP SMEAR-Modifier  Never done  . COLONOSCOPY (Pts 45-32yrs Insurance coverage will need to be  confirmed)  Never done    There are no preventive care reminders to display for this patient.  No results found for: TSH Lab Results  Component Value Date   WBC 9.7 07/26/2017   HGB 14.3 07/26/2017   HCT 44.7 07/26/2017   MCV 93.3 07/26/2017   PLT 403 (H) 07/26/2017   Lab Results  Component Value Date  NA 135 07/26/2017   K 3.6 07/26/2017   CO2 27 07/26/2017   GLUCOSE 102 (H) 07/26/2017   BUN 7 07/26/2017   CREATININE 0.49 07/26/2017   CALCIUM 9.2 07/26/2017   ANIONGAP 8 07/26/2017   No results found for: CHOL No results found for: HDL No results found for: LDLCALC No results found for: TRIG No results found for: CHOLHDL No results found for: JOIN8M    Assessment & Plan:   Problem List Items Addressed This Visit      Musculoskeletal and Integument   Arthritis of knee    Had intraarticular steroid recently in Orthopedic urgent care Referred to Orthopedic surgery for further evaluation Weight loss would improve pain and inflammation overall.        Other   ADD (attention deficit disorder)    On Adderall Follows up with Psychiatrist       Other Visit Diagnoses    Eczema, unspecified type    -  Primary Rash appears to be allergic in etiology, likely eczema Started Kenalog cream   Relevant Medications   triamcinolone cream (KENALOG) 0.1 %   Need for hepatitis C screening test       Relevant Orders   Hepatitis C Antibody      Meds ordered this encounter  Medications  . triamcinolone cream (KENALOG) 0.1 %    Sig: Apply 1 application topically 2 (two) times daily.    Dispense:  30 g    Refill:  0    Follow-up: Return in about 6 months (around 06/13/2021) for Annual physical.    Anabel Halon, MD

## 2020-12-12 NOTE — Patient Instructions (Signed)
Please apply Triamcinolone cream over right hand area. Keep the area clean and dry.  Please get fasting blood tests done within a week.  Follow up with Orthopedic surgeon for knee pain as scheduled.

## 2020-12-12 NOTE — Assessment & Plan Note (Signed)
Had intraarticular steroid recently in Orthopedic urgent care Referred to Orthopedic surgery for further evaluation Weight loss would improve pain and inflammation overall.

## 2020-12-22 NOTE — Telephone Encounter (Signed)
Patient returned call. Aware appointment is pending Dr Mort Sawyers review for 2nd opinion evaluation of pain in both knees, right worse than left. Notes and CD from [facility] placed in Dr's box.

## 2020-12-22 NOTE — Telephone Encounter (Signed)
Called back to patient. Have been unable to leave message at patient's phone to notify records received/films also here, from [facility]. Left message at alternate phone # to return call.

## 2021-01-25 ENCOUNTER — Ambulatory Visit: Payer: 59 | Admitting: Internal Medicine

## 2021-02-01 NOTE — Telephone Encounter (Signed)
See referral workqueue for calls to patient and for all pertinent information. Notes/CD of Xrays received.

## 2021-02-07 ENCOUNTER — Encounter: Payer: Self-pay | Admitting: Orthopedic Surgery

## 2021-02-22 ENCOUNTER — Encounter: Payer: Self-pay | Admitting: Orthopedic Surgery

## 2021-02-25 LAB — LIPID PANEL
Chol/HDL Ratio: 2.8 ratio (ref 0.0–4.4)
Cholesterol, Total: 158 mg/dL (ref 100–199)
HDL: 56 mg/dL (ref >39)
LDL Chol Calc (NIH): 86 mg/dL (ref 0–99)
Triglycerides: 87 mg/dL (ref 0–149)
VLDL Cholesterol Cal: 16 mg/dL (ref 5–40)

## 2021-02-25 LAB — CMP14+EGFR
ALT: 105 IU/L — ABNORMAL HIGH (ref 0–32)
AST: 79 IU/L — ABNORMAL HIGH (ref 0–40)
Albumin/Globulin Ratio: 1.4 (ref 1.2–2.2)
Albumin: 4.2 g/dL (ref 3.8–4.8)
Alkaline Phosphatase: 190 IU/L — ABNORMAL HIGH (ref 44–121)
BUN/Creatinine Ratio: 23 (ref 9–23)
BUN: 12 mg/dL (ref 6–24)
Bilirubin Total: 0.4 mg/dL (ref 0.0–1.2)
CO2: 23 mmol/L (ref 20–29)
Calcium: 9.4 mg/dL (ref 8.7–10.2)
Chloride: 98 mmol/L (ref 96–106)
Creatinine, Ser: 0.53 mg/dL — ABNORMAL LOW (ref 0.57–1.00)
Globulin, Total: 2.9 g/dL (ref 1.5–4.5)
Glucose: 96 mg/dL (ref 65–99)
Potassium: 4.5 mmol/L (ref 3.5–5.2)
Sodium: 136 mmol/L (ref 134–144)
Total Protein: 7.1 g/dL (ref 6.0–8.5)
eGFR: 113 mL/min/{1.73_m2} (ref >59)

## 2021-02-25 LAB — VITAMIN D 25 HYDROXY (VIT D DEFICIENCY, FRACTURES): Vit D, 25-Hydroxy: 26.3 ng/mL — ABNORMAL LOW (ref 30.0–100.0)

## 2021-02-25 LAB — CBC
Hematocrit: 42.7 % (ref 34.0–46.6)
Hemoglobin: 14.6 g/dL (ref 11.1–15.9)
MCH: 30.1 pg (ref 26.6–33.0)
MCHC: 34.2 g/dL (ref 31.5–35.7)
MCV: 88 fL (ref 79–97)
Platelets: 332 10*3/uL (ref 150–450)
RBC: 4.85 x10E6/uL (ref 3.77–5.28)
RDW: 12.9 % (ref 11.7–15.4)
WBC: 11.1 10*3/uL — ABNORMAL HIGH (ref 3.4–10.8)

## 2021-02-25 LAB — TSH: TSH: 1.69 u[IU]/mL (ref 0.450–4.500)

## 2021-02-25 LAB — HEMOGLOBIN A1C
Est. average glucose Bld gHb Est-mCnc: 128 mg/dL
Hgb A1c MFr Bld: 6.1 % — ABNORMAL HIGH (ref 4.8–5.6)

## 2021-02-25 LAB — HEPATITIS C ANTIBODY: Hep C Virus Ab: <0.1 s/co ratio (ref 0.0–0.9)

## 2021-03-15 ENCOUNTER — Encounter: Payer: Self-pay | Admitting: Orthopedic Surgery

## 2021-03-15 ENCOUNTER — Ambulatory Visit (INDEPENDENT_AMBULATORY_CARE_PROVIDER_SITE_OTHER): Payer: 59 | Admitting: Orthopedic Surgery

## 2021-03-15 ENCOUNTER — Other Ambulatory Visit: Payer: Self-pay

## 2021-03-15 VITALS — BP 171/96 | HR 102 | Ht 67.0 in | Wt 297.5 lb

## 2021-03-15 DIAGNOSIS — M1712 Unilateral primary osteoarthritis, left knee: Secondary | ICD-10-CM

## 2021-03-15 DIAGNOSIS — F17200 Nicotine dependence, unspecified, uncomplicated: Secondary | ICD-10-CM | POA: Diagnosis not present

## 2021-03-15 DIAGNOSIS — M1711 Unilateral primary osteoarthritis, right knee: Secondary | ICD-10-CM

## 2021-03-15 DIAGNOSIS — M48062 Spinal stenosis, lumbar region with neurogenic claudication: Secondary | ICD-10-CM

## 2021-03-15 DIAGNOSIS — Z6841 Body Mass Index (BMI) 40.0 and over, adult: Secondary | ICD-10-CM

## 2021-03-15 MED ORDER — MELOXICAM 7.5 MG PO TABS: 7.5000 mg | ORAL_TABLET | Freq: Every day | ORAL | 5 refills | Status: DC

## 2021-03-15 MED ORDER — METHOCARBAMOL 500 MG PO TABS: 500.0000 mg | ORAL_TABLET | Freq: Three times a day (TID) | ORAL | 1 refills | Status: DC

## 2021-03-15 NOTE — Progress Notes (Signed)
NEW PROBLEM//OFFICE VISIT  Summary assessment and plan:   Encounter Diagnoses  Name Primary?   Arthritis of right knee    Arthritis of left knee    Spinal stenosis of lumbar region with neurogenic claudication Yes   Needs smoking cessation education    Body mass index 45.0-49.9, adult (HCC)    Morbid obesity (HCC)     50 year old female with mild arthritis of both knees has spinal stenosis with claudication  Recommend Mobic once a day for her arthritis pain some Robaxin for the tightness in her hamstrings and recommend that her primary care doctor refer her to neurosurgery for evaluation of the spinal stenosis  Chief Complaint  Patient presents with   Knee Pain    Bilateral knee pain, the left one is the worst today. It hurts in the back of both knees real bad.   50 year old female second opinion regarding her bilateral knee pain  She says she has bilateral anterior knee pain she has had injections over-the-counter medication including Goody powders ibuprofen naproxen she had intra-articular injection by Dr.Noris but she comes in today complaining of pain behind her left leg and thigh also on the right worse on the left and inability to walk long distances  Further questioning reveals she has a positive grocery cart sign when she is in the stores for grocery shopping and she is concerned she cannot walk her dog and she cannot keep up with her family members when walking  She has tried some weight loss activities but the inability to walk is limited that as well    MEDICAL DECISION MAKING  A.  Encounter Diagnoses  Name Primary?   Arthritis of right knee    Arthritis of left knee    Spinal stenosis of lumbar region with neurogenic claudication Yes   Needs smoking cessation education    Body mass index 45.0-49.9, adult (HCC)    Morbid obesity (HCC)     B. DATA ANALYSED:   IMAGING: Interpretation of images: External images show mild joint space narrowing symmetric both  knees  Orders: None  Outside records reviewed: Dr. Collins [place] records as indicated   C. MANAGEMENT   Anti-inflammatories for the knees she seems to have plenty of cartilage left in the knee her main problem right now is spinal stenosis with claudication  Meds ordered this encounter  Medications   methocarbamol (ROBAXIN) 500 MG tablet    Sig: Take 1 tablet (500 mg total) by mouth 3 (three) times daily.    Dispense:  60 tablet    Refill:  1   meloxicam (MOBIC) 7.5 MG tablet    Sig: Take 1 tablet (7.5 mg total) by mouth daily.    Dispense:  30 tablet    Refill:  5     BP (!) 171/96   Pulse (!) 102   Ht 5\' 7"  (1.702 m)   Wt 297 lb 8 oz (134.9 kg)   LMP 07/07/2017   BMI 46.60 kg/m    General appearance: Well-developed well-nourished no gross deformities  Cardiovascular normal pulse and perfusion normal color without edema  Neurologically no sensation loss or deficits or pathologic reflexes  Psychological: Awake alert and oriented x3 mood and affect normal  Skin no lacerations or ulcerations no nodularity no palpable masses, no erythema or nodularity  Musculoskeletal: She is maintained good range of motion in both knees there is no effusion she does have peripatellar tenderness bilaterally  She has lumbar spine tenderness on both sides in the midline  of the spine primarily L4-S1 region     Review of Systems  Musculoskeletal:  Positive for back pain and joint pain.    Past Medical History:  Diagnosis Date   ADD (attention deficit disorder)    Anxiety    Arthritis    Phreesia 09/13/2020   Depression    Depression    Phreesia 09/13/2020   Skin ulcer of female breast (HCC) 07/27/2017    No past surgical history on file.  Family History  Problem Relation Age of Onset   Lung cancer Father        Exposure to asbestos. Mesothelioma?   Alcoholism Father    Social History   Tobacco Use   Smoking status: Every Day    Packs/day: 0.50    Types: Cigarettes    Smokeless tobacco: Never  Vaping Use   Vaping Use: Never used  Substance Use Topics   Alcohol use: No   Drug use: No    No Known Allergies  Current Meds  Medication Sig   ALPRAZolam (XANAX) 1 MG tablet Take 1 mg by mouth 2 (two) times daily as needed for anxiety.   amphetamine-dextroamphetamine (ADDERALL) 20 MG tablet Take 20 mg by mouth 3 (three) times daily.   buprenorphine (SUBUTEX) 8 MG SUBL SL tablet Place under the tongue.   meloxicam (MOBIC) 7.5 MG tablet Take 1 tablet (7.5 mg total) by mouth daily.   methocarbamol (ROBAXIN) 500 MG tablet Take 1 tablet (500 mg total) by mouth 3 (three) times daily.   omeprazole (PRILOSEC) 20 MG capsule Take 20 mg by mouth daily.   triamcinolone cream (KENALOG) 0.1 % Apply 1 application topically 2 (two) times daily.        Fuller Canada, MD  03/15/2021 10:41 AM

## 2021-03-15 NOTE — Patient Instructions (Signed)

## 2021-03-17 ENCOUNTER — Other Ambulatory Visit: Payer: Self-pay

## 2021-03-17 ENCOUNTER — Other Ambulatory Visit: Payer: Self-pay | Admitting: Internal Medicine

## 2021-03-17 DIAGNOSIS — R7401 Elevation of levels of liver transaminase levels: Secondary | ICD-10-CM

## 2021-03-23 ENCOUNTER — Ambulatory Visit (HOSPITAL_COMMUNITY): Payer: 59

## 2021-03-23 ENCOUNTER — Encounter (HOSPITAL_COMMUNITY): Payer: Self-pay

## 2021-04-12 ENCOUNTER — Telehealth: Payer: Self-pay | Admitting: Orthopedic Surgery

## 2021-04-12 NOTE — Telephone Encounter (Signed)
Patient called to ask about where Dr Romeo Apple had referred her to, at time of visit 03/15/21?  States has not heard.

## 2021-04-12 NOTE — Telephone Encounter (Signed)
There is nothing in the chart about a referral / no referral made. Dr Romeo Apple did mention she has spinal stenosis. I will call her.

## 2021-04-12 NOTE — Telephone Encounter (Signed)
I called her told her no referral was made, but Dr Romeo Apple did mention he thought pain was from her back from Spinal stenosis. She states she thought she was being referred somewhere else. Please advise.

## 2021-04-13 NOTE — Telephone Encounter (Signed)
Called her to advise. She voiced understanding I did not see the primary care note , thanks.

## 2021-04-25 ENCOUNTER — Ambulatory Visit (INDEPENDENT_AMBULATORY_CARE_PROVIDER_SITE_OTHER): Payer: 59 | Admitting: Internal Medicine

## 2021-04-25 ENCOUNTER — Other Ambulatory Visit: Payer: Self-pay

## 2021-04-25 ENCOUNTER — Encounter: Payer: Self-pay | Admitting: Internal Medicine

## 2021-04-25 VITALS — BP 139/88 | HR 90 | Temp 97.5°F | Resp 18 | Ht 67.0 in | Wt 300.1 lb

## 2021-04-25 DIAGNOSIS — M5416 Radiculopathy, lumbar region: Secondary | ICD-10-CM | POA: Insufficient documentation

## 2021-04-25 DIAGNOSIS — Z23 Encounter for immunization: Secondary | ICD-10-CM

## 2021-04-25 NOTE — Assessment & Plan Note (Signed)
Will check X-ray of lumbar spine to look for DDD of spine If inconclusive, will check MRI of lumbar spine to r/o lumbar spinal stenosis and/or herniated disc Continue Robaxin PRN and Tylenol PRN Referred to Spine surgery

## 2021-04-25 NOTE — Patient Instructions (Signed)
Please get X-ray of lumbar spine done at [facility].  You are being referred to Spine surgery.

## 2021-04-25 NOTE — Progress Notes (Signed)
Acute Office Visit  Subjective:    Patient ID: Andrea Watts, female    DOB: Apr 12, 1971, 51 y.o.   MRN: [id number]  Chief Complaint  Patient presents with   Back Pain    Pt was seeing harrison for knee pain he told her back pain was caused by knees she needs MRI ordered harrison told her to see pcp.     HPI Patient is in today for evaluation of chronic, persistent back pain, which is sharp, radiates to buttocks and b/l LE and is worse with movement and standing and better with rest. She denies any recent injury or lifting heavy weight. She has seen Orthopedic surgeon for knee pain. Denies any saddle anesthesia, urinary or stool incontinence.  Past Medical History:  Diagnosis Date   ADD (attention deficit disorder)    Anxiety    Arthritis    Phreesia 09/13/2020   Depression    Depression    Phreesia 09/13/2020   Skin ulcer of female breast ([place]) 07/27/2017    History reviewed. No pertinent surgical history.  Family History  Problem Relation Age of Onset   Lung cancer Father        Exposure to asbestos. Mesothelioma?   Alcoholism Father     Social History   Socioeconomic History   Marital status: Married    Spouse name: Not on file   Number of children: Not on file   Years of education: Not on file   Highest education level: Not on file  Occupational History   Not on file  Tobacco Use   Smoking status: Every Day    Packs/day: 0.50    Types: Cigarettes   Smokeless tobacco: Never  Vaping Use   Vaping Use: Never used  Substance and Sexual Activity   Alcohol use: No   Drug use: No   Sexual activity: Yes    Birth control/protection: None  Other Topics Concern   Not on file  Social History Narrative   Not on file   Social Determinants of Health   Financial Resource Strain: Not on file  Food Insecurity: Not on file  Transportation Needs: Not on file  Physical Activity: Not on file  Stress: Not on file  Social Connections: Not on file  Intimate Partner  Violence: Not on file    Outpatient Medications Prior to Visit  Medication Sig Dispense Refill   ALPRAZolam (XANAX) 1 MG tablet Take 1 mg by mouth 2 (two) times daily as needed for anxiety.     amphetamine-dextroamphetamine (ADDERALL) 20 MG tablet Take 20 mg by mouth 3 (three) times daily.  0   buprenorphine (SUBUTEX) 8 MG SUBL SL tablet Place under the tongue.     methocarbamol (ROBAXIN) 500 MG tablet Take 1 tablet (500 mg total) by mouth 3 (three) times daily. 60 tablet 1   omeprazole (PRILOSEC) 20 MG capsule Take 20 mg by mouth daily.     triamcinolone cream (KENALOG) 0.1 % Apply 1 application topically 2 (two) times daily. 30 g 0   meloxicam (MOBIC) 7.5 MG tablet Take 1 tablet (7.5 mg total) by mouth daily. (Patient not taking: Reported on 04/25/2021) 30 tablet 5   No facility-administered medications prior to visit.    No Known Allergies  Review of Systems  Constitutional:  Negative for chills and fever.  HENT:  Negative for congestion, sinus pressure, sinus pain and sore throat.   Eyes:  Negative for pain and discharge.  Respiratory:  Negative for cough and shortness of  breath.   Cardiovascular:  Negative for chest pain and palpitations.  Gastrointestinal:  Negative for abdominal pain, constipation, diarrhea, nausea and vomiting.  Endocrine: Negative for polydipsia and polyuria.  Genitourinary:  Negative for dysuria and hematuria.  Musculoskeletal:  Positive for arthralgias and back pain. Negative for neck pain and neck stiffness.  Skin:  Negative for rash.  Neurological:  Negative for dizziness and weakness.  Psychiatric/Behavioral:  Negative for agitation, behavioral problems and suicidal ideas. The patient is nervous/anxious.       Objective:    Physical Exam Vitals reviewed.  Constitutional:      General: She is not in acute distress.    Appearance: She is not diaphoretic.  HENT:     Head: Normocephalic and atraumatic.     Nose: Nose normal.     Mouth/Throat:      Mouth: Mucous membranes are moist.  Eyes:     General: No scleral icterus.    Extraocular Movements: Extraocular movements intact.     Pupils: Pupils are equal, round, and reactive to light.  Cardiovascular:     Rate and Rhythm: Normal rate and regular rhythm.     Pulses: Normal pulses.     Heart sounds: No murmur heard. Pulmonary:     Breath sounds: Normal breath sounds. No wheezing or rales.  Abdominal:     Palpations: Abdomen is soft.     Tenderness: There is no abdominal tenderness.  Musculoskeletal:        General: Tenderness (Lumbar spine area) present.     Cervical back: Neck supple. No tenderness.     Right lower leg: Edema (Trace) present.     Left lower leg: Edema (1+) present.  Skin:    General: Skin is warm.     Findings: No rash.     Comments: Chronic erythematous skin changes over b/l LE  Neurological:     General: No focal deficit present.     Mental Status: She is alert and oriented to person, place, and time.     Sensory: No sensory deficit.     Motor: No weakness.  Psychiatric:        Mood and Affect: Mood normal.        Behavior: Behavior normal.    BP 139/88 (BP Location: Left Arm, Patient Position: Sitting, Cuff Size: Normal)   Pulse 90   Temp (!) 97.5 F (36.4 C) (Oral)   Resp 18   Ht 5' 7" (1.702 m)   Wt (!) 300 lb 1.9 oz (136.1 kg)   LMP 07/07/2017   SpO2 92%   BMI 47.01 kg/m  Wt Readings from Last 3 Encounters:  04/25/21 (!) 300 lb 1.9 oz (136.1 kg)  03/15/21 297 lb 8 oz (134.9 kg)  12/12/20 293 lb 1.9 oz (133 kg)    Health Maintenance Due  Topic Date Due   COVID-19 Vaccine (1) Never done   Pneumococcal Vaccine 9-65 Years old (1 - PCV) Never done   TETANUS/TDAP  Never done   PAP SMEAR-Modifier  Never done   COLONOSCOPY (Pts 45-51yr Insurance coverage will need to be confirmed)  Never done    There are no preventive care reminders to display for this patient.   Lab Results  Component Value Date   TSH 1.690 02/24/2021   Lab  Results  Component Value Date   WBC 11.1 (H) 02/24/2021   HGB 14.6 02/24/2021   HCT 42.7 02/24/2021   MCV 88 02/24/2021   PLT 332 02/24/2021  Lab Results  Component Value Date   NA 136 02/24/2021   K 4.5 02/24/2021   CO2 23 02/24/2021   GLUCOSE 96 02/24/2021   BUN 12 02/24/2021   CREATININE 0.53 (L) 02/24/2021   BILITOT 0.4 02/24/2021   ALKPHOS 190 (H) 02/24/2021   AST 79 (H) 02/24/2021   ALT 105 (H) 02/24/2021   PROT 7.1 02/24/2021   ALBUMIN 4.2 02/24/2021   CALCIUM 9.4 02/24/2021   ANIONGAP 8 07/26/2017   EGFR 113 02/24/2021   Lab Results  Component Value Date   CHOL 158 02/24/2021   Lab Results  Component Value Date   HDL 56 02/24/2021   Lab Results  Component Value Date   LDLCALC 86 02/24/2021   Lab Results  Component Value Date   TRIG 87 02/24/2021   Lab Results  Component Value Date   CHOLHDL 2.8 02/24/2021   Lab Results  Component Value Date   HGBA1C 6.1 (H) 02/24/2021       Assessment & Plan:   Problem List Items Addressed This Visit       Nervous and Auditory   Lumbar radiculopathy - Primary    Will check X-ray of lumbar spine to look for DDD of spine If inconclusive, will check MRI of lumbar spine to r/o lumbar spinal stenosis and/or herniated disc Continue Robaxin PRN and Tylenol PRN Referred to Spine surgery      Relevant Orders   DG Lumbar Spine Complete   Ambulatory referral to Spine Surgery     Other   Need for immunization against influenza   Relevant Orders   Flu Vaccine QUAD 8moIM (Fluarix, Fluzone & Alfiuria Quad PF) (Completed)     No orders of the defined types were placed in this encounter.    RLindell Spar MD

## 2021-05-05 ENCOUNTER — Ambulatory Visit (HOSPITAL_COMMUNITY)
Admission: RE | Admit: 2021-05-05 | Discharge: 2021-05-05 | Disposition: A | Discharge status: Home / Self Care | Payer: 59 | Source: Ambulatory Visit | Attending: Internal Medicine | Admitting: Internal Medicine

## 2021-05-05 ENCOUNTER — Other Ambulatory Visit: Payer: Self-pay

## 2021-05-05 DIAGNOSIS — M5416 Radiculopathy, lumbar region: Secondary | ICD-10-CM | POA: Diagnosis not present

## 2021-05-08 ENCOUNTER — Encounter: Payer: Self-pay | Admitting: Internal Medicine

## 2021-05-08 NOTE — Telephone Encounter (Signed)
Patient called with result message

## 2021-05-17 ENCOUNTER — Ambulatory Visit: Payer: 59 | Admitting: Orthopedic Surgery

## 2021-05-22 ENCOUNTER — Encounter: Payer: 59 | Admitting: Internal Medicine

## 2021-05-24 ENCOUNTER — Ambulatory Visit: Payer: 59 | Admitting: Orthopedic Surgery

## 2021-06-13 ENCOUNTER — Encounter: Payer: 59 | Admitting: Internal Medicine

## 2021-08-02 ENCOUNTER — Other Ambulatory Visit: Payer: Self-pay | Admitting: Neurosurgery

## 2021-08-02 ENCOUNTER — Other Ambulatory Visit (HOSPITAL_COMMUNITY): Payer: Self-pay | Admitting: Neurosurgery

## 2021-08-02 DIAGNOSIS — M5440 Lumbago with sciatica, unspecified side: Secondary | ICD-10-CM

## 2021-08-16 ENCOUNTER — Ambulatory Visit (HOSPITAL_COMMUNITY)
Admission: RE | Admit: 2021-08-16 | Discharge: 2021-08-16 | Disposition: A | Discharge status: Home / Self Care | Payer: 59 | Source: Ambulatory Visit | Attending: Neurosurgery | Admitting: Neurosurgery

## 2021-08-16 ENCOUNTER — Other Ambulatory Visit: Payer: Self-pay

## 2021-08-16 DIAGNOSIS — M5440 Lumbago with sciatica, unspecified side: Secondary | ICD-10-CM | POA: Insufficient documentation

## 2021-09-29 ENCOUNTER — Encounter: Payer: Self-pay | Admitting: Internal Medicine

## 2022-01-09 ENCOUNTER — Other Ambulatory Visit (HOSPITAL_COMMUNITY): Payer: Self-pay | Admitting: Sports Medicine

## 2022-01-09 DIAGNOSIS — Z1231 Encounter for screening mammogram for malignant neoplasm of breast: Secondary | ICD-10-CM

## 2022-01-31 ENCOUNTER — Other Ambulatory Visit (HOSPITAL_COMMUNITY): Payer: Self-pay | Admitting: Sports Medicine

## 2022-01-31 DIAGNOSIS — Z1231 Encounter for screening mammogram for malignant neoplasm of breast: Secondary | ICD-10-CM

## 2022-02-12 ENCOUNTER — Inpatient Hospital Stay (HOSPITAL_COMMUNITY): Admission: RE | Admit: 2022-02-12 | Payer: 59 | Source: Ambulatory Visit

## 2022-02-12 ENCOUNTER — Encounter (HOSPITAL_COMMUNITY): Payer: Self-pay

## 2022-02-12 DIAGNOSIS — Z1231 Encounter for screening mammogram for malignant neoplasm of breast: Secondary | ICD-10-CM

## 2022-04-13 ENCOUNTER — Other Ambulatory Visit (HOSPITAL_COMMUNITY): Payer: Self-pay | Admitting: Sports Medicine

## 2022-04-13 DIAGNOSIS — Z1231 Encounter for screening mammogram for malignant neoplasm of breast: Secondary | ICD-10-CM

## 2022-04-16 ENCOUNTER — Inpatient Hospital Stay (HOSPITAL_COMMUNITY): Admission: RE | Admit: 2022-04-16 | Payer: 59 | Source: Ambulatory Visit

## 2022-04-16 DIAGNOSIS — Z1231 Encounter for screening mammogram for malignant neoplasm of breast: Secondary | ICD-10-CM

## 2022-05-15 ENCOUNTER — Encounter: Payer: Self-pay | Admitting: *Deleted

## 2022-05-30 ENCOUNTER — Other Ambulatory Visit (HOSPITAL_COMMUNITY): Payer: Self-pay | Admitting: Sports Medicine

## 2022-05-30 DIAGNOSIS — Z1231 Encounter for screening mammogram for malignant neoplasm of breast: Secondary | ICD-10-CM

## 2022-06-04 ENCOUNTER — Inpatient Hospital Stay (HOSPITAL_COMMUNITY): Admission: RE | Admit: 2022-06-04 | Payer: 59 | Source: Ambulatory Visit

## 2022-06-04 DIAGNOSIS — Z1231 Encounter for screening mammogram for malignant neoplasm of breast: Secondary | ICD-10-CM

## 2022-07-23 ENCOUNTER — Other Ambulatory Visit (HOSPITAL_COMMUNITY): Payer: Self-pay | Admitting: Sports Medicine

## 2022-07-23 DIAGNOSIS — Z1231 Encounter for screening mammogram for malignant neoplasm of breast: Secondary | ICD-10-CM

## 2022-07-25 ENCOUNTER — Ambulatory Visit (HOSPITAL_COMMUNITY)
Admission: RE | Admit: 2022-07-25 | Discharge: 2022-07-25 | Disposition: A | Discharge status: Home / Self Care | Payer: 59 | Source: Ambulatory Visit | Attending: Sports Medicine | Admitting: Sports Medicine

## 2022-07-25 ENCOUNTER — Inpatient Hospital Stay (HOSPITAL_COMMUNITY): Admission: RE | Admit: 2022-07-25 | Payer: 59 | Source: Ambulatory Visit

## 2022-07-25 ENCOUNTER — Encounter (HOSPITAL_COMMUNITY): Payer: Self-pay

## 2022-07-25 DIAGNOSIS — Z1231 Encounter for screening mammogram for malignant neoplasm of breast: Secondary | ICD-10-CM

## 2022-10-18 ENCOUNTER — Encounter: Payer: Self-pay | Admitting: Radiology

## 2022-10-19 ENCOUNTER — Encounter: Payer: Self-pay | Admitting: *Deleted

## 2022-11-27 ENCOUNTER — Encounter: Payer: Self-pay | Admitting: Orthopedic Surgery

## 2022-11-27 ENCOUNTER — Ambulatory Visit (INDEPENDENT_AMBULATORY_CARE_PROVIDER_SITE_OTHER): Payer: 59 | Admitting: Orthopedic Surgery

## 2022-11-27 VITALS — BP 142/85 | HR 87 | Ht 67.0 in | Wt 283.0 lb

## 2022-11-27 DIAGNOSIS — R202 Paresthesia of skin: Secondary | ICD-10-CM

## 2022-11-27 DIAGNOSIS — R200 Anesthesia of skin: Secondary | ICD-10-CM | POA: Diagnosis not present

## 2022-11-27 NOTE — Progress Notes (Signed)
New Patient Visit  Assessment: Andrea Watts is a 52 y.o. female with the following: 1. Numbness and tingling in both hands  Plan: Tiahna P Winebrenner has pain, numbness and tingling with associated swelling.  Her presentation is consistent with carpal tunnel syndrome.  Left is currently worse than right.  He has been ongoing for several years.  No obvious strength deficits.  She has tried bracing, as well as medications with limited improvement in her symptoms.  I would like for her to obtain EMGs bilaterally.  Follow-up: Return for After EMG.  Subjective:  Chief Complaint  Patient presents with   Hand Pain    Bil deep aching wrist pain for years but worse after using a saw to cut wood and it's been worse. Also states she drops a lot of things now.     History of Present Illness: Andrea Watts is a 52 y.o. female who presents for evaluation of bilateral hand pain.  She has had pain in both hands for many years.  Left is currently worse than right.  She describes pain along the right thumb, with some associated numbness and tingling bilaterally.  She is try medications, and bracing in the past.  She feels as though she is waited for a long period of time.  She denies weakness, but notes that her symptoms cause her to drop things.   Review of Systems: No fevers or chills No numbness or tingling No chest pain No shortness of breath No bowel or bladder dysfunction No GI distress No headaches   Medical History:  Past Medical History:  Diagnosis Date   ADD (attention deficit disorder)    Anxiety    Arthritis    Phreesia 09/13/2020   Depression    Depression    Phreesia 09/13/2020   Skin ulcer of female breast 07/27/2017    No past surgical history on file.  Family History  Problem Relation Age of Onset   Lung cancer Father        Exposure to asbestos. Mesothelioma?   Alcoholism Father    Social History   Tobacco Use   Smoking status: Every Day    Packs/day: .5     Types: Cigarettes   Smokeless tobacco: Never  Vaping Use   Vaping Use: Never used  Substance Use Topics   Alcohol use: No   Drug use: No    No Known Allergies  Current Meds  Medication Sig   ALPRAZolam (XANAX) 1 MG tablet Take 1 mg by mouth 2 (two) times daily as needed for anxiety.   amphetamine-dextroamphetamine (ADDERALL) 20 MG tablet Take 20 mg by mouth 3 (three) times daily.   buprenorphine (SUBUTEX) 8 MG SUBL SL tablet Place under the tongue.   citalopram (CELEXA) 20 MG tablet Take 20 mg by mouth daily.   omeprazole (PRILOSEC) 20 MG capsule Take 20 mg by mouth daily.    Objective: BP (!) 142/85   Pulse 87   Ht 5\' 7"  (1.702 m)   Wt 283 lb (128.4 kg)   LMP 07/07/2017   BMI 44.32 kg/m   Physical Exam:  General: Alert and oriented. and No acute distress. Gait: Normal gait.  Evaluation of bilateral hands demonstrates no swelling.  No atrophy within the thenar eminence.  Good grip strength.  Mildly positive Tinel's.  Positive Phalen's.  Positive Durkan's compression testing.  Fingers warm and well-perfused.  Sensation is intact throughout both hands currently.  IMAGING: No new imaging obtained today   New Medications:  No orders of the defined types were placed in this encounter.     Oliver Barre, MD  11/27/2022 10:21 AM

## 2022-12-06 ENCOUNTER — Telehealth: Payer: Self-pay | Admitting: Physical Medicine and Rehabilitation

## 2022-12-06 NOTE — Telephone Encounter (Signed)
Patient called. She would like an appointment with Dr. Alvester Morin. Her call back number is [phone]

## 2022-12-06 NOTE — Telephone Encounter (Signed)
Spoke with patient and scheduled NCV for 12/14/22.

## 2022-12-14 ENCOUNTER — Ambulatory Visit (INDEPENDENT_AMBULATORY_CARE_PROVIDER_SITE_OTHER): Payer: 59 | Admitting: Physical Medicine and Rehabilitation

## 2022-12-14 DIAGNOSIS — R202 Paresthesia of skin: Secondary | ICD-10-CM | POA: Diagnosis not present

## 2022-12-14 DIAGNOSIS — R531 Weakness: Secondary | ICD-10-CM | POA: Diagnosis not present

## 2022-12-14 DIAGNOSIS — M7989 Other specified soft tissue disorders: Secondary | ICD-10-CM

## 2022-12-14 DIAGNOSIS — M79641 Pain in right hand: Secondary | ICD-10-CM | POA: Diagnosis not present

## 2022-12-14 DIAGNOSIS — M79642 Pain in left hand: Secondary | ICD-10-CM

## 2022-12-14 NOTE — Progress Notes (Signed)
Functional Pain Scale - descriptive words and definitions  Uncomfortable (3)  Pain is present but can complete all ADL's/sleep is slightly affected and passive distraction only gives marginal relief. Mild range order  Average Pain  varies  Right handed. Pain and numbness in both hands, mainly in the thumbs

## 2022-12-19 NOTE — Procedures (Signed)
EMG & NCV Findings: Evaluation of the left median motor and the right median motor nerves showed prolonged distal onset latency (L5.9, R5.5 ms), reduced amplitude (L4.6, R4.8 mV), and decreased conduction velocity (Elbow-Wrist, L47, R44 m/s).  The left median (across palm) sensory nerve showed prolonged distal peak latency (Wrist, 5.5 ms) and prolonged distal peak latency (Palm, 18.0 ms).  The right median (across palm) sensory nerve showed no response (Palm) and prolonged distal peak latency (5.0 ms).  The right ulnar sensory nerve showed prolonged distal peak latency (4.0 ms) and decreased conduction velocity (Wrist-5th Digit, 35 m/s).  All remaining nerves (as indicated in the following tables) were within normal limits.  Left vs. Right side comparison data for the ulnar motor nerve indicates abnormal L-R velocity difference (B Elbow-Wrist, 18 m/s).  The ulnar sensory nerve indicates abnormal L-R latency difference (0.5 ms).  All remaining left vs. right side differences were within normal limits.    Needle evaluation of the right abductor pollicis brevis muscle showed increased insertional activity and diminished recruitment.  All remaining muscles (as indicated in the following table) showed no evidence of electrical instability.    Impression: The above electrodiagnostic study is ABNORMAL and reveals evidence of a moderate to severe right slightly worse than left bilateral median nerve entrapment at the wrist (carpal tunnel syndrome) affecting sensory and motor components.   There is no significant electrodiagnostic evidence of any other focal nerve entrapment, brachial plexopathy or generalized peripheral neuropathy.   Recommendations: 1.  Follow-up with referring physician. 2.  Continue current management of symptoms. 3.  Continue use of resting splint at night-time and as needed during the day. 4.  Suggest surgical evaluation.  ___________________________ Andrea Watts FAAPMR Board  Certified, American Board of Physical Medicine and Rehabilitation    Nerve Conduction Studies Anti Sensory Summary Table   Stim Site NR Peak (ms) Norm Peak (ms) P-T Amp (V) Norm P-T Amp Site1 Site2 Delta-P (ms) Dist (cm) Vel (m/s) Norm Vel (m/s)  Left Median Acr Palm Anti Sensory (2nd Digit)  29.6C  Wrist    *5.5 <3.6 21.1 >10 Wrist Palm 12.5 0.0    Palm    *18.0 <2.0 2.8         Right Median Acr Palm Anti Sensory (2nd Digit)  28.4C  Wrist    *5.0 <3.6 39.9 >10 Wrist Palm  0.0    Palm *NR  <2.0          Left Radial Anti Sensory (Base 1st Digit)  28.8C  Wrist    2.1 <3.1 72.0  Wrist Base 1st Digit 2.1 0.0    Right Radial Anti Sensory (Base 1st Digit)  28.2C  Wrist    2.4 <3.1 52.2  Wrist Base 1st Digit 2.4 0.0    Left Ulnar Anti Sensory (5th Digit)  29.7C  Wrist    3.5 <3.7 22.6 >15.0 Wrist 5th Digit 3.5 14.0 40 >38  Right Ulnar Anti Sensory (5th Digit)  28.5C  Wrist    *4.0 <3.7 26.1 >15.0 Wrist 5th Digit 4.0 14.0 *35 >38   Motor Summary Table   Stim Site NR Onset (ms) Norm Onset (ms) O-P Amp (mV) Norm O-P Amp Site1 Site2 Delta-0 (ms) Dist (cm) Vel (m/s) Norm Vel (m/s)  Left Median Motor (Abd Poll Brev)  29.2C  Wrist    *5.9 <4.2 *4.6 >5 Elbow Wrist 4.3 20.0 *47 >50  Elbow    10.2  1.9         Right Median Motor (  Abd Poll Brev)  28.5C  Wrist    *5.5 <4.2 *4.8 >5 Elbow Wrist 4.2 18.5 *44 >50  Elbow    9.7  1.7         Left Ulnar Motor (Abd Dig Min)  29.3C  Wrist    3.8 <4.2 8.8 >3 B Elbow Wrist 2.8 22.0 79 >53  B Elbow    6.6  11.7  A Elbow B Elbow 1.0 10.0 100 >53  A Elbow    7.6  11.5         Right Ulnar Motor (Abd Dig Min)  28.6C  Wrist    3.9 <4.2 7.4 >3 B Elbow Wrist 3.1 19.0 61 >53  B Elbow    7.0  9.9  A Elbow B Elbow 1.1 11.0 100 >53  A Elbow    8.1  9.5          EMG   Side Muscle Nerve Root Ins Act Fibs Psw Amp Dur Poly Recrt Int Dennie Bible Comment  Right Abd Poll Brev Median C8-T1 *CRD Nml Nml Nml Nml 0 *Reduced Nml   Right 1stDorInt Ulnar C8-T1 Nml Nml Nml  Nml Nml 0 Nml Nml   Right PronatorTeres Median C6-7 Nml Nml Nml Nml Nml 0 Nml Nml   Right Biceps Musculocut C5-6 Nml Nml Nml Nml Nml 0 Nml Nml   Right Deltoid Axillary C5-6 Nml Nml Nml Nml Nml 0 Nml Nml   Left Abd Poll Brev Median C8-T1 Nml Nml Nml Nml Nml 0 Nml Nml   Left 1stDorInt Ulnar C8-T1 Nml Nml Nml Nml Nml 0 Nml Nml   Left PronatorTeres Median C6-7 Nml Nml Nml Nml Nml 0 Nml Nml   Left Biceps Musculocut C5-6 Nml Nml Nml Nml Nml 0 Nml Nml   Left Deltoid Axillary C5-6 Nml Nml Nml Nml Nml 0 Nml Nml     Nerve Conduction Studies Anti Sensory Left/Right Comparison   Stim Site L Lat (ms) R Lat (ms) L-R Lat (ms) L Amp (V) R Amp (V) L-R Amp (%) Site1 Site2 L Vel (m/s) R Vel (m/s) L-R Vel (m/s)  Median Acr Palm Anti Sensory (2nd Digit)  29.6C  Wrist *5.5 *5.0 0.5 21.1 39.9 47.1 Wrist Palm     Palm *18.0   2.8         Radial Anti Sensory (Base 1st Digit)  28.8C  Wrist 2.1 2.4 0.3 72.0 52.2 27.5 Wrist Base 1st Digit     Ulnar Anti Sensory (5th Digit)  29.7C  Wrist 3.5 *4.0 *0.5 22.6 26.1 13.4 Wrist 5th Digit 40 *35 5   Motor Left/Right Comparison   Stim Site L Lat (ms) R Lat (ms) L-R Lat (ms) L Amp (mV) R Amp (mV) L-R Amp (%) Site1 Site2 L Vel (m/s) R Vel (m/s) L-R Vel (m/s)  Median Motor (Abd Poll Brev)  29.2C  Wrist *5.9 *5.5 0.4 *4.6 *4.8 4.2 Elbow Wrist *47 *44 3  Elbow 10.2 9.7 0.5 1.9 1.7 10.5       Ulnar Motor (Abd Dig Min)  29.3C  Wrist 3.8 3.9 0.1 8.8 7.4 15.9 B Elbow Wrist 79 61 *18  B Elbow 6.6 7.0 0.4 11.7 9.9 15.4 A Elbow B Elbow 100 100 0  A Elbow 7.6 8.1 0.5 11.5 9.5 17.4          Waveforms:

## 2022-12-21 ENCOUNTER — Encounter: Payer: Self-pay | Admitting: Physical Medicine and Rehabilitation

## 2022-12-21 DIAGNOSIS — M5440 Lumbago with sciatica, unspecified side: Secondary | ICD-10-CM | POA: Insufficient documentation

## 2022-12-21 NOTE — Progress Notes (Signed)
Andrea Watts - 52 y.o. female MRN [id number]  Date of birth: 1971-04-22  Office Visit Note: Visit Date: 12/14/2022 PCP: Waldon Reining, MD Referred by: Oliver Barre, MD  Subjective: Chief Complaint  Patient presents with   Right Hand - Numbness, Pain   Left Hand - Numbness, Pain   HPI: Andrea Watts is a 52 y.o. female who comes in today at the request of Dr. Thane Edu for evaluation and management of chronic, worsening and severe pain, numbness and tingling in the Bilateral upper extremities.  Patient is Right hand dominant.  She reports many months of worsening severe at times hand pain bilaterally left somewhat more than right.  Pain is mostly into the thumbs bilaterally worse with using her hands.  She reports some weakness but not focal weakness.  She gets a lot of symptoms really again with activity such as using a saws etc.  She denies any symptoms really down the arms but gets some referral up the arm to the elbow at times.  She endorses paresthesias or numbness sensation but it is globally in the whole hand.  Over the years she has had nocturnal complaints that would awaken her.  She endorses a positive flick sign.  She does have a history of neck and back pain but more lumbar than cervical pain.  No prior cervical surgery.  She is not diabetic.  She has failed care including splints and medications.  No prior electrodiagnostic study.   I spent more than 30 minutes speaking face-to-face with the patient with 50% of the time in counseling and discussing coordination of care.     Review of Systems  Musculoskeletal:  Positive for joint pain.  Neurological:  Positive for tingling and weakness.  All other systems reviewed and are negative.  Otherwise per HPI.  Assessment & Plan: Visit Diagnoses:    ICD-10-CM   1. Paresthesia of skin  R20.2 NCV with EMG (electromyography)    2. Bilateral hand pain  M79.641    M79.642     3. Weakness  R53.1     4. Hand swelling   M79.89        Plan: Impression: Multifactorial hand pain with somewhat nondermatomal numbness and tingling.  Really does seem to be related to carpal tunnel syndrome type symptoms in the radial digits when pressed on the clinical interview.  Symptoms have been ongoing for quite some time and she does have clear symptoms probably of CMC arthritis as well.  Electrodiagnostic study.  The above electrodiagnostic study is ABNORMAL and reveals evidence of a moderate to severe right slightly worse than left bilateral median nerve entrapment at the wrist (carpal tunnel syndrome) affecting sensory and motor components.   There is no significant electrodiagnostic evidence of any other focal nerve entrapment, brachial plexopathy or generalized peripheral neuropathy.   Recommendations: 1.  Follow-up with referring physician. 2.  Continue current management of symptoms. 3.  Continue use of resting splint at night-time and as needed during the day. 4.  Suggest surgical evaluation.  Meds & Orders: No orders of the defined types were placed in this encounter.   Orders Placed This Encounter  Procedures   NCV with EMG (electromyography)    Follow-up: Return for Thane Edu, MD.   Procedures: No procedures performed  EMG & NCV Findings: Evaluation of the left median motor and the right median motor nerves showed prolonged distal onset latency (L5.9, R5.5 ms), reduced amplitude (L4.6, R4.8 mV), and decreased conduction velocity (  Elbow-Wrist, L47, R44 m/s).  The left median (across palm) sensory nerve showed prolonged distal peak latency (Wrist, 5.5 ms) and prolonged distal peak latency (Palm, 18.0 ms).  The right median (across palm) sensory nerve showed no response (Palm) and prolonged distal peak latency (5.0 ms).  The right ulnar sensory nerve showed prolonged distal peak latency (4.0 ms) and decreased conduction velocity (Wrist-5th Digit, 35 m/s).  All remaining nerves (as indicated in the following tables)  were within normal limits.  Left vs. Right side comparison data for the ulnar motor nerve indicates abnormal L-R velocity difference (B Elbow-Wrist, 18 m/s).  The ulnar sensory nerve indicates abnormal L-R latency difference (0.5 ms).  All remaining left vs. right side differences were within normal limits.    Needle evaluation of the right abductor pollicis brevis muscle showed increased insertional activity and diminished recruitment.  All remaining muscles (as indicated in the following table) showed no evidence of electrical instability.    Impression: The above electrodiagnostic study is ABNORMAL and reveals evidence of a moderate to severe right slightly worse than left bilateral median nerve entrapment at the wrist (carpal tunnel syndrome) affecting sensory and motor components.   There is no significant electrodiagnostic evidence of any other focal nerve entrapment, brachial plexopathy or generalized peripheral neuropathy.   Recommendations: 1.  Follow-up with referring physician. 2.  Continue current management of symptoms. 3.  Continue use of resting splint at night-time and as needed during the day. 4.  Suggest surgical evaluation.  ___________________________ Naaman Plummer FAAPMR Board Certified, American Board of Physical Medicine and Rehabilitation    Nerve Conduction Studies Anti Sensory Summary Table   Stim Site NR Peak (ms) Norm Peak (ms) P-T Amp (V) Norm P-T Amp Site1 Site2 Delta-P (ms) Dist (cm) Vel (m/s) Norm Vel (m/s)  Left Median Acr Palm Anti Sensory (2nd Digit)  29.6C  Wrist    *5.5 <3.6 21.1 >10 Wrist Palm 12.5 0.0    Palm    *18.0 <2.0 2.8         Right Median Acr Palm Anti Sensory (2nd Digit)  28.4C  Wrist    *5.0 <3.6 39.9 >10 Wrist Palm  0.0    Palm *NR  <2.0          Left Radial Anti Sensory (Base 1st Digit)  28.8C  Wrist    2.1 <3.1 72.0  Wrist Base 1st Digit 2.1 0.0    Right Radial Anti Sensory (Base 1st Digit)  28.2C  Wrist    2.4 <3.1 52.2  Wrist  Base 1st Digit 2.4 0.0    Left Ulnar Anti Sensory (5th Digit)  29.7C  Wrist    3.5 <3.7 22.6 >15.0 Wrist 5th Digit 3.5 14.0 40 >38  Right Ulnar Anti Sensory (5th Digit)  28.5C  Wrist    *4.0 <3.7 26.1 >15.0 Wrist 5th Digit 4.0 14.0 *35 >38   Motor Summary Table   Stim Site NR Onset (ms) Norm Onset (ms) O-P Amp (mV) Norm O-P Amp Site1 Site2 Delta-0 (ms) Dist (cm) Vel (m/s) Norm Vel (m/s)  Left Median Motor (Abd Poll Brev)  29.2C  Wrist    *5.9 <4.2 *4.6 >5 Elbow Wrist 4.3 20.0 *47 >50  Elbow    10.2  1.9         Right Median Motor (Abd Poll Brev)  28.5C  Wrist    *5.5 <4.2 *4.8 >5 Elbow Wrist 4.2 18.5 *44 >50  Elbow    9.7  1.7  Left Ulnar Motor (Abd Dig Min)  29.3C  Wrist    3.8 <4.2 8.8 >3 B Elbow Wrist 2.8 22.0 79 >53  B Elbow    6.6  11.7  A Elbow B Elbow 1.0 10.0 100 >53  A Elbow    7.6  11.5         Right Ulnar Motor (Abd Dig Min)  28.6C  Wrist    3.9 <4.2 7.4 >3 B Elbow Wrist 3.1 19.0 61 >53  B Elbow    7.0  9.9  A Elbow B Elbow 1.1 11.0 100 >53  A Elbow    8.1  9.5          EMG   Side Muscle Nerve Root Ins Act Fibs Psw Amp Dur Poly Recrt Int Dennie Bible Comment  Right Abd Poll Brev Median C8-T1 *CRD Nml Nml Nml Nml 0 *Reduced Nml   Right 1stDorInt Ulnar C8-T1 Nml Nml Nml Nml Nml 0 Nml Nml   Right PronatorTeres Median C6-7 Nml Nml Nml Nml Nml 0 Nml Nml   Right Biceps Musculocut C5-6 Nml Nml Nml Nml Nml 0 Nml Nml   Right Deltoid Axillary C5-6 Nml Nml Nml Nml Nml 0 Nml Nml   Left Abd Poll Brev Median C8-T1 Nml Nml Nml Nml Nml 0 Nml Nml   Left 1stDorInt Ulnar C8-T1 Nml Nml Nml Nml Nml 0 Nml Nml   Left PronatorTeres Median C6-7 Nml Nml Nml Nml Nml 0 Nml Nml   Left Biceps Musculocut C5-6 Nml Nml Nml Nml Nml 0 Nml Nml   Left Deltoid Axillary C5-6 Nml Nml Nml Nml Nml 0 Nml Nml     Nerve Conduction Studies Anti Sensory Left/Right Comparison   Stim Site L Lat (ms) R Lat (ms) L-R Lat (ms) L Amp (V) R Amp (V) L-R Amp (%) Site1 Site2 L Vel (m/s) R Vel (m/s) L-R Vel (m/s)   Median Acr Palm Anti Sensory (2nd Digit)  29.6C  Wrist *5.5 *5.0 0.5 21.1 39.9 47.1 Wrist Palm     Palm *18.0   2.8         Radial Anti Sensory (Base 1st Digit)  28.8C  Wrist 2.1 2.4 0.3 72.0 52.2 27.5 Wrist Base 1st Digit     Ulnar Anti Sensory (5th Digit)  29.7C  Wrist 3.5 *4.0 *0.5 22.6 26.1 13.4 Wrist 5th Digit 40 *35 5   Motor Left/Right Comparison   Stim Site L Lat (ms) R Lat (ms) L-R Lat (ms) L Amp (mV) R Amp (mV) L-R Amp (%) Site1 Site2 L Vel (m/s) R Vel (m/s) L-R Vel (m/s)  Median Motor (Abd Poll Brev)  29.2C  Wrist *5.9 *5.5 0.4 *4.6 *4.8 4.2 Elbow Wrist *47 *44 3  Elbow 10.2 9.7 0.5 1.9 1.7 10.5       Ulnar Motor (Abd Dig Min)  29.3C  Wrist 3.8 3.9 0.1 8.8 7.4 15.9 B Elbow Wrist 79 61 *18  B Elbow 6.6 7.0 0.4 11.7 9.9 15.4 A Elbow B Elbow 100 100 0  A Elbow 7.6 8.1 0.5 11.5 9.5 17.4          Waveforms:                      Clinical History: No specialty comments available.   She reports that she has been smoking cigarettes. She has been smoking an average of .5 packs per day. She has never used smokeless tobacco. No results for input(s): "HGBA1C", "LABURIC" in the last 8760  hours.  Objective:  VS:  HT:    WT:   BMI:     BP:   HR: bpm  TEMP: ( )  RESP:  Physical Exam Vitals and nursing note reviewed.  Constitutional:      General: She is not in acute distress.    Appearance: Normal appearance. She is well-developed. She is obese. She is not ill-appearing.  HENT:     Head: Normocephalic and atraumatic.  Eyes:     Conjunctiva/sclera: Conjunctivae normal.     Pupils: Pupils are equal, round, and reactive to light.  Cardiovascular:     Rate and Rhythm: Normal rate.     Pulses: Normal pulses.  Pulmonary:     Effort: Pulmonary effort is normal.  Musculoskeletal:        General: No swelling or deformity.     Cervical back: Normal range of motion. Tenderness present.     Right lower leg: No edema.     Left lower leg: No edema.     Comments:  Inspection reveals no atrophy of the bilateral APB or FDI or hand intrinsics. There is no swelling, color changes, allodynia or dystrophic changes. There is 5 out of 5 strength in the bilateral wrist extension, finger abduction and long finger flexion. There is intact sensation to light touch in all dermatomal and peripheral nerve distributions. There is a negative Tinel's test at the bilateral wrist and elbow. There is a positive Phalen's test bilaterally. There is a negative Hoffmann's test bilaterally.  Skin:    General: Skin is warm and dry.     Findings: No erythema or rash.  Neurological:     General: No focal deficit present.     Mental Status: She is alert and oriented to person, place, and time.     Sensory: No sensory deficit.     Motor: No weakness or abnormal muscle tone.     Coordination: Coordination normal.     Gait: Gait normal.  Psychiatric:        Mood and Affect: Mood normal.        Behavior: Behavior normal.     Ortho Exam  Imaging: No results found.  Past Medical/Family/Surgical/Social History: Medications & Allergies reviewed per EMR, new medications updated. Patient Active Problem List   Diagnosis Date Noted   Lumbago with sciatica 12/21/2022   Lumbar radiculopathy 04/25/2021   Opioid dependence (HCC) 12/12/2020   Bipolar disorder (HCC) 09/27/2020   GERD (gastroesophageal reflux disease) 09/27/2020   Need for immunization against influenza 09/27/2020   Leg swelling 09/27/2020   Arthritis of knee 09/27/2020   Morbid obesity (HCC) 09/27/2020   Depression 07/24/2017   Personality disorder (HCC) 07/24/2017   Tobacco use 07/24/2017   ADD (attention deficit disorder) 07/24/2017   Breast nodule 07/24/2017   Past Medical History:  Diagnosis Date   ADD (attention deficit disorder)    Anxiety    Arthritis    [employer] 09/13/2020   Depression    Depression    [employer] 09/13/2020   Skin ulcer of female breast (HCC) 07/27/2017   Family History  Problem  Relation Age of Onset   Lung cancer Father        Exposure to asbestos. Mesothelioma?   Alcoholism Father    History reviewed. No pertinent surgical history. Social History   Occupational History   Not on file  Tobacco Use   Smoking status: Every Day    Packs/day: .5    Types: Cigarettes   Smokeless tobacco:  Never  Vaping Use   Vaping Use: Never used  Substance and Sexual Activity   Alcohol use: No   Drug use: No   Sexual activity: Yes    Birth control/protection: None

## 2023-01-01 ENCOUNTER — Ambulatory Visit: Payer: 59 | Admitting: Orthopedic Surgery

## 2023-01-16 ENCOUNTER — Ambulatory Visit (INDEPENDENT_AMBULATORY_CARE_PROVIDER_SITE_OTHER): Payer: 59 | Admitting: Orthopedic Surgery

## 2023-01-16 ENCOUNTER — Encounter: Payer: Self-pay | Admitting: Orthopedic Surgery

## 2023-01-16 DIAGNOSIS — G5603 Carpal tunnel syndrome, bilateral upper limbs: Secondary | ICD-10-CM

## 2023-01-16 DIAGNOSIS — G5602 Carpal tunnel syndrome, left upper limb: Secondary | ICD-10-CM

## 2023-01-16 NOTE — Progress Notes (Signed)
Orthopaedic Clinic Return  Assessment: Andrea Watts is a 52 y.o. female with the following: Left carpal tunnel syndrome  Plan: Andrea Watts continues to have pain, numbness and tingling in the median nerve distribution of bilateral hands.  More specifically she has pain within the thenar eminence bilaterally.  Symptoms in the left are currently worse than the right.  EMGs completed by Dr. Ezzard Standing demonstrate moderate to severe symptoms bilaterally.  We discussed open carpal tunnel release, and all questions were answered.  She is interested in proceeding with surgery in her left hand.  She will work to obtain medical clearance, and her case will be posted accordingly.  Risks and benefits of the surgery, including, but not limited to infection, bleeding, persistent pain, need for further surgery, recurrence of symptoms and more severe complications associated with anesthesia were discussed with the patient.  The patient has elected to proceed.   Follow-up: Return for After surgery.   Subjective:  Chief Complaint  Patient presents with   Bilateral hand pain    Here to discuss EMG findings     History of Present Illness: Andrea Watts is a 51 y.o. female who returns to clinic for repeat evaluation of bilateral hand pain.  She has pain, numbness and tingling in the median nerve distribution bilaterally.  Left hand is currently worse than the right.  She has radiating pains into the thenar eminence bilaterally.  She has previously attempted bracing and medications without improvement in her symptoms.  She has had EMGs, and is here to discuss the findings.  Review of Systems: No fevers or chills + numbness & tingling No chest pain No shortness of breath No bowel or bladder dysfunction No GI distress No headaches   Objective: LMP 07/07/2017   Physical Exam:  Alert and oriented.  No acute distress.  Evaluation of bilateral hands demonstrates no deformity.  No atrophy of the  thenar eminence bilaterally.  Positive Tinel's bilaterally.  Positive Phalen's bilaterally.  Positive Durkan's.  IMAGING: I personally ordered and reviewed the following images:  No new imaging obtained today.  EMG results demonstrate moderate to severe carpal tunnel syndrome bilaterally, right slightly worse than left   Oliver Barre, MD 01/16/2023 4:46 PM

## 2023-01-16 NOTE — Patient Instructions (Signed)
Plan for left open carpal tunnel release  We will confirm medical clearance for surgery and then make plans to schedule surgery

## 2023-01-30 ENCOUNTER — Other Ambulatory Visit (HOSPITAL_COMMUNITY): Payer: Self-pay | Admitting: Family Medicine

## 2023-01-30 DIAGNOSIS — R7989 Other specified abnormal findings of blood chemistry: Secondary | ICD-10-CM

## 2023-02-25 ENCOUNTER — Ambulatory Visit (HOSPITAL_COMMUNITY): Payer: 59

## 2023-03-08 ENCOUNTER — Ambulatory Visit (HOSPITAL_COMMUNITY): Payer: 59 | Attending: Family Medicine

## 2023-03-08 ENCOUNTER — Encounter (HOSPITAL_COMMUNITY): Payer: Self-pay

## 2023-04-16 ENCOUNTER — Encounter: Payer: Self-pay | Admitting: Orthopedic Surgery

## 2023-04-18 ENCOUNTER — Ambulatory Visit (HOSPITAL_COMMUNITY): Payer: 59 | Attending: Family Medicine

## 2023-04-23 NOTE — Telephone Encounter (Signed)
Message was sent to April B., she has spoke with pt and surgery is being scheduled.

## 2023-05-07 ENCOUNTER — Ambulatory Visit: Payer: 59 | Admitting: Obstetrics & Gynecology

## 2023-05-21 ENCOUNTER — Ambulatory Visit: Payer: 59 | Admitting: Obstetrics & Gynecology

## 2023-05-23 NOTE — Patient Instructions (Signed)
Andrea Watts  05/23/2023     @PREFPERIOPPHARMACY @   Your procedure is scheduled on  05/27/2023.   Report to [facility] at  0600  A.M.   Call this number if you have problems the morning of surgery:  [phone]  If you experience any cold or flu symptoms such as cough, fever, chills, shortness of breath, etc. between now and your scheduled surgery, please notify us at the above number.   Remember:  Do not eat after midnight.   You may drink clear liquids until 0330 am on 05/27/2023.    Clear liquids allowed are:                    Water, Juice (No red color; non-citric and without pulp; diabetics please choose diet or no sugar options), Carbonated beverages (diabetics please choose diet or no sugar options), Clear Tea (No creamer, milk, or cream, including half & half and powdered creamer), Black Coffee Only (No creamer, milk or cream, including half & half and powdered creamer), Plain Jell-O Only (No red color; diabetics please choose no sugar options), Clear Sports drink (No red color; diabetics please choose diet or no sugar options), and Plain Popsicles Only (No red color; diabetics please choose no sugar options)       At 0330 am on 05/27/2023 drink your carb drink. You can have nothing else to drink after this.    Take these medicines the morning of surgery with A SIP OF WATER                       xanax(if needed), lexapro, omeprazole.     Do not wear jewelry, make-up or nail polish, including gel polish,  artificial nails, or any other type of covering on natural nails (fingers and  toes).  Do not wear lotions, powders, or perfumes, or deodorant.  Do not shave 48 hours prior to surgery.  Men may shave face and neck.  Do not bring valuables to the hospital.  [facility] is not responsible for any belongings or valuables.  Contacts, dentures or bridgework may not be worn into surgery.  Leave your suitcase in the car.  After surgery it may be brought to  your room.  For patients admitted to the hospital, discharge time will be determined by your treatment team.  Patients discharged the day of surgery will not be allowed to drive home and must have someone with them for 24 hours.     Special instructions:   DO NOT smoke tobacco or vape for 24 hours before your procedure.  Please read over the following fact sheets that you were given. Coughing and Deep Breathing, Surgical Site Infection Prevention, Anesthesia Post-op Instructions, and Care and Recovery After Surgery      Open Carpal Tunnel Release, Care After This sheet gives you information about how to care for yourself after your procedure. Your health care provider may also give you more specific instructions. If you have problems or questions, contact your health care provider. What can I expect after the procedure? After the procedure, it is common to have: Pain. Swelling. Wrist stiffness. Bruising. Follow these instructions at home: Medicines Take over-the-counter and prescription medicines only as told by your health care provider. Ask your health care provider if the medicine prescribed to you: Requires you to avoid driving or using machinery. Can cause constipation. You may need to take these actions to  prevent or treat constipation: Drink enough fluid to keep your urine pale yellow. Take over-the-counter or prescription medicines. Eat foods that are high in fiber, such as beans, whole grains, and fresh fruits and vegetables. Limit foods that are high in fat and processed sugars, such as fried or sweet foods. Bathing Do not take baths, swim, or use a hot tub until your health care provider approves. Ask your health care provider if you may take showers. Keep your bandage (dressing) dry until your health care provider says it can be removed. Cover it with a watertight covering when you take a bath or a shower. If you have a splint or brace: Wear the splint or brace as  told by your health care provider. You may need to wear it for 2-3 weeks. Remove it only as told by your health care provider. Loosen the splint or brace if your fingers tingle, become numb, or turn cold and blue. Keep the splint or brace clean. If the splint or brace is not waterproof: Do not let it get wet. Cover it with a watertight covering when you take a bath or a shower. Incision care  After the compression bandage has been removed, follow instructions from your health care provider about how to take care of your incision. Make sure you: Wash your hands with soap and water for at least 20 seconds before and after you change your bandage (dressing). If soap and water are not available, use hand sanitizer. Change your dressing as told by your health care provider. Leave stitches (sutures), skin glue, or adhesive strips in place. These skin closures may need to stay in place for 2 weeks or longer. If adhesive strip edges start to loosen and curl up, you may trim the loose edges. Do not remove adhesive strips completely unless your health care provider tells you to do that. Check your incision area every day for signs of infection. Check for: Redness. More swelling or pain. Fluid or blood. Warmth. Pus or a bad smell. Managing pain, stiffness, and swelling  If directed, put ice on the affected area. If you have a removable splint or brace, remove it as told by your health care provider. Put ice in a plastic bag. Place a towel between your skin and the bag. Leave the ice on for 20 minutes, 2-3 times a day. Do not fall asleep with ice pack on your skin. Remove the ice if your skin turns bright red. This is very important. If you cannot feel pain, heat, or cold, you have a greater risk of damage to the area. Move your fingers often to avoid stiffness and to lessen swelling. Raise (elevate) your wrist above the level of your heart while you are sitting or lying down. Activity Do not drive  until your health care provider approves. Use your hand carefully. Do not do activities that cause pain. You should be able to do light activities with your hand. Do not lift with your affected hand until your health care provider approves. Avoid pulling and pushing with the injured arm. Return to your normal activities as told by your health care provider. Ask your health care provider what activities are safe for you. If physical therapy was prescribed, do exercises as told by your therapist. Physical therapy can help you heal faster and regain movement. General instructions Do not use any products that contain nicotine or tobacco, such as cigarettes and e-cigarettes. These can delay incision healing after surgery. If you need help quitting,  ask your health care provider. Keep all follow-up visits. This is important. These include visits for physical therapy. Contact a health care provider if: You have redness around your incision. You have more swelling or pain. You have fluid or blood coming from your incision. Your incision feels warm to the touch. You have pus or a bad smell coming from your incision. You have a fever or chills. You have pain that does not get better with medicine. Your carpal tunnel symptoms do not go away after 2 months. Your carpal tunnel symptoms go away and then come back. Get help right away if: You have pain or numbness that is getting worse. Your fingers or fingertips become very pale or bluish in color. You are not able to move your fingers. Summary It is common to have wrist stiffness and bruising after a carpal tunnel release. Icing and raising (elevating) your wrist may help to lessen swelling and pain. Call your health care provider if you have a fever or notice any signs of infection in your incision area. This information is not intended to replace advice given to you by your health care provider. Make sure you discuss any questions you have with your  health care provider. Document Revised: 12/10/2019 Document Reviewed: 12/10/2019 Elsevier Patient Education  2024 [employer] Monitored Anesthesia Care, Care After The following information offers guidance on how to care for yourself after your procedure. Your health care provider may also give you more specific instructions. If you have problems or questions, contact your health care provider. What can I expect after the procedure? After the procedure, it is common to have: Tiredness. Little or no memory about what happened during or after the procedure. Impaired judgment when it comes to making decisions. Nausea or vomiting. Some trouble with balance. Follow these instructions at home: For the time period you were told by your health care provider:  Rest. Do not participate in activities where you could fall or become injured. Do not drive or use machinery. Do not drink alcohol. Do not take sleeping pills or medicines that cause drowsiness. Do not make important decisions or sign legal documents. Do not take care of children on your own. Medicines Take over-the-counter and prescription medicines only as told by your health care provider. If you were prescribed antibiotics, take them as told by your health care provider. Do not stop using the antibiotic even if you start to feel better. Eating and drinking Follow instructions from your health care provider about what you may eat and drink. Drink enough fluid to keep your urine pale yellow. If you vomit: Drink clear fluids slowly and in small amounts as you are able. Clear fluids include water, ice chips, low-calorie sports drinks, and fruit juice that has water added to it (diluted fruit juice). Eat light and bland foods in small amounts as you are able. These foods include bananas, applesauce, rice, lean meats, toast, and crackers. General instructions  Have a responsible adult stay with you for the time you are told. It is  important to have someone help care for you until you are awake and alert. If you have sleep apnea, surgery and some medicines can increase your risk for breathing problems. Follow instructions from your health care provider about wearing your sleep device: When you are sleeping. This includes during daytime naps. While taking prescription pain medicines, sleeping medicines, or medicines that make you drowsy. Do not use any products that contain nicotine or tobacco. These products include cigarettes,  chewing tobacco, and vaping devices, such as e-cigarettes. If you need help quitting, ask your health care provider. Contact a health care provider if: You feel nauseous or vomit every time you eat or drink. You feel light-headed. You are still sleepy or having trouble with balance after 24 hours. You get a rash. You have a fever. You have redness or swelling around the IV site. Get help right away if: You have trouble breathing. You have new confusion after you get home. These symptoms may be an emergency. Get help right away. Call 911. Do not wait to see if the symptoms will go away. Do not drive yourself to the hospital. This information is not intended to replace advice given to you by your health care provider. Make sure you discuss any questions you have with your health care provider. Document Revised: 01/01/2022 Document Reviewed: 01/01/2022 Elsevier Patient Education  2024 [employer] Inc. How to Use Chlorhexidine Before Surgery Chlorhexidine gluconate (CHG) is a germ-killing (antiseptic) solution that is used to clean the skin. It can get rid of the bacteria that normally live on the skin and can keep them away for about 24 hours. To clean your skin with CHG, you may be given: A CHG solution to use in the shower or as part of a sponge bath. A prepackaged cloth that contains CHG. Cleaning your skin with CHG may help lower the risk for infection: While you are staying in the intensive  care unit of the hospital. If you have a vascular access, such as a central line, to provide short-term or long-term access to your veins. If you have a catheter to drain urine from your bladder. If you are on a ventilator. A ventilator is a machine that helps you breathe by moving air in and out of your lungs. After surgery. What are the risks? Risks of using CHG include: A skin reaction. Hearing loss, if CHG gets in your ears and you have a perforated eardrum. Eye injury, if CHG gets in your eyes and is not rinsed out. The CHG product catching fire. Make sure that you avoid smoking and flames after applying CHG to your skin. Do not use CHG: If you have a chlorhexidine allergy or have previously reacted to chlorhexidine. On babies younger than 22 months of age. How to use CHG solution Use CHG only as told by your health care provider, and follow the instructions on the label. Use the full amount of CHG as directed. Usually, this is one bottle. During a shower Follow these steps when using CHG solution during a shower (unless your health care provider gives you different instructions): Start the shower. Use your normal soap and shampoo to wash your face and hair. Turn off the shower or move out of the shower stream. Pour the CHG onto a clean washcloth. Do not use any type of brush or rough-edged sponge. Starting at your neck, lather your body down to your toes. Make sure you follow these instructions: If you will be having surgery, pay special attention to the part of your body where you will be having surgery. Scrub this area for at least 1 minute. Do not use CHG on your head or face. If the solution gets into your ears or eyes, rinse them well with water. Avoid your genital area. Avoid any areas of skin that have broken skin, cuts, or scrapes. Scrub your back and under your arms. Make sure to wash skin folds. Let the lather sit on your skin for  1-2 minutes or as long as told by your  health care provider. Thoroughly rinse your entire body in the shower. Make sure that all body creases and crevices are rinsed well. Dry off with a clean towel. Do not put any substances on your body afterward--such as powder, lotion, or perfume--unless you are told to do so by your health care provider. Only use lotions that are recommended by the manufacturer. Put on clean clothes or pajamas. If it is the night before your surgery, sleep in clean sheets.  During a sponge bath Follow these steps when using CHG solution during a sponge bath (unless your health care provider gives you different instructions): Use your normal soap and shampoo to wash your face and hair. Pour the CHG onto a clean washcloth. Starting at your neck, lather your body down to your toes. Make sure you follow these instructions: If you will be having surgery, pay special attention to the part of your body where you will be having surgery. Scrub this area for at least 1 minute. Do not use CHG on your head or face. If the solution gets into your ears or eyes, rinse them well with water. Avoid your genital area. Avoid any areas of skin that have broken skin, cuts, or scrapes. Scrub your back and under your arms. Make sure to wash skin folds. Let the lather sit on your skin for 1-2 minutes or as long as told by your health care provider. Using a different clean, wet washcloth, thoroughly rinse your entire body. Make sure that all body creases and crevices are rinsed well. Dry off with a clean towel. Do not put any substances on your body afterward--such as powder, lotion, or perfume--unless you are told to do so by your health care provider. Only use lotions that are recommended by the manufacturer. Put on clean clothes or pajamas. If it is the night before your surgery, sleep in clean sheets. How to use CHG prepackaged cloths Only use CHG cloths as told by your health care provider, and follow the instructions on the  label. Use the CHG cloth on clean, dry skin. Do not use the CHG cloth on your head or face unless your health care provider tells you to. When washing with the CHG cloth: Avoid your genital area. Avoid any areas of skin that have broken skin, cuts, or scrapes. Before surgery Follow these steps when using a CHG cloth to clean before surgery (unless your health care provider gives you different instructions): Using the CHG cloth, vigorously scrub the part of your body where you will be having surgery. Scrub using a back-and-forth motion for 3 minutes. The area on your body should be completely wet with CHG when you are done scrubbing. Do not rinse. Discard the cloth and let the area air-dry. Do not put any substances on the area afterward, such as powder, lotion, or perfume. Put on clean clothes or pajamas. If it is the night before your surgery, sleep in clean sheets.  For general bathing Follow these steps when using CHG cloths for general bathing (unless your health care provider gives you different instructions). Use a separate CHG cloth for each area of your body. Make sure you wash between any folds of skin and between your fingers and toes. Wash your body in the following order, switching to a new cloth after each step: The front of your neck, shoulders, and chest. Both of your arms, under your arms, and your hands. Your stomach and groin  area, avoiding the genitals. Your right leg and foot. Your left leg and foot. The back of your neck, your back, and your buttocks. Do not rinse. Discard the cloth and let the area air-dry. Do not put any substances on your body afterward--such as powder, lotion, or perfume--unless you are told to do so by your health care provider. Only use lotions that are recommended by the manufacturer. Put on clean clothes or pajamas. Contact a health care provider if: Your skin gets irritated after scrubbing. You have questions about using your solution or  cloth. You swallow any chlorhexidine. Call your local poison control center [phone] in the [place] Get help right away if: Your eyes itch badly, or they become very red or swollen. Your skin itches badly and is red or swollen. Your hearing changes. You have trouble seeing. You have swelling or tingling in your mouth or throat. You have trouble breathing. These symptoms may represent a serious problem that is an emergency. Do not wait to see if the symptoms will go away. Get medical help right away. Call your local emergency services (911 in the [place] Do not drive yourself to the hospital. Summary Chlorhexidine gluconate (CHG) is a germ-killing (antiseptic) solution that is used to clean the skin. Cleaning your skin with CHG may help to lower your risk for infection. You may be given CHG to use for bathing. It may be in a bottle or in a prepackaged cloth to use on your skin. Carefully follow your health care provider's instructions and the instructions on the product label. Do not use CHG if you have a chlorhexidine allergy. Contact your health care provider if your skin gets irritated after scrubbing. This information is not intended to replace advice given to you by your health care provider. Make sure you discuss any questions you have with your health care provider. Document Revised: 12/04/2021 Document Reviewed: 10/17/2020 Elsevier Patient Education  2023 [employer].

## 2023-05-24 ENCOUNTER — Encounter (HOSPITAL_COMMUNITY)
Admission: RE | Admit: 2023-05-24 | Discharge: 2023-05-24 | Disposition: A | Discharge status: Home / Self Care | Payer: 59 | Source: Ambulatory Visit | Attending: Orthopedic Surgery | Admitting: Orthopedic Surgery

## 2023-05-24 ENCOUNTER — Encounter (HOSPITAL_COMMUNITY): Payer: Self-pay

## 2023-05-24 DIAGNOSIS — Z720 Tobacco use: Secondary | ICD-10-CM | POA: Diagnosis not present

## 2023-05-24 DIAGNOSIS — R7303 Prediabetes: Secondary | ICD-10-CM | POA: Diagnosis not present

## 2023-05-24 DIAGNOSIS — Z01818 Encounter for other preprocedural examination: Secondary | ICD-10-CM | POA: Diagnosis present

## 2023-05-24 HISTORY — DX: Essential (primary) hypertension: I10

## 2023-05-24 HISTORY — DX: Prediabetes: R73.03

## 2023-05-24 LAB — CBC
HCT: 45.9 % (ref 36.0–46.0)
Hemoglobin: 15.8 g/dL — ABNORMAL HIGH (ref 12.0–15.0)
MCH: 31.4 pg (ref 26.0–34.0)
MCHC: 34.4 g/dL (ref 30.0–36.0)
MCV: 91.3 fL (ref 80.0–100.0)
Platelets: 309 10*3/uL (ref 150–400)
RBC: 5.03 MIL/uL (ref 3.87–5.11)
RDW: 12.9 % (ref 11.5–15.5)
WBC: 12 10*3/uL — ABNORMAL HIGH (ref 4.0–10.5)
nRBC: 0 % (ref 0.0–0.2)

## 2023-05-24 LAB — HEMOGLOBIN A1C
Hgb A1c MFr Bld: 5.7 % — ABNORMAL HIGH (ref 4.8–5.6)
Mean Plasma Glucose: 116.89 mg/dL

## 2023-05-24 LAB — BASIC METABOLIC PANEL
Anion gap: 9 (ref 5–15)
BUN: 7 mg/dL (ref 6–20)
CO2: 23 mmol/L (ref 22–32)
Calcium: 9.1 mg/dL (ref 8.9–10.3)
Chloride: 101 mmol/L (ref 98–111)
Creatinine, Ser: 0.62 mg/dL (ref 0.44–1.00)
GFR, Estimated: >60 mL/min (ref >60)
Glucose, Bld: 107 mg/dL — ABNORMAL HIGH (ref 70–99)
Potassium: 4 mmol/L (ref 3.5–5.1)
Sodium: 133 mmol/L — ABNORMAL LOW (ref 135–145)

## 2023-05-27 ENCOUNTER — Ambulatory Visit (HOSPITAL_BASED_OUTPATIENT_CLINIC_OR_DEPARTMENT_OTHER): Payer: 59 | Admitting: Anesthesiology

## 2023-05-27 ENCOUNTER — Encounter (HOSPITAL_COMMUNITY)
Admission: RE | Disposition: A | Discharge status: Home / Self Care | Payer: Self-pay | Source: Ambulatory Visit | Attending: Orthopedic Surgery

## 2023-05-27 ENCOUNTER — Ambulatory Visit (HOSPITAL_COMMUNITY)
Admission: RE | Admit: 2023-05-27 | Discharge: 2023-05-27 | Disposition: A | Discharge status: Home / Self Care | Payer: 59 | Source: Ambulatory Visit | Attending: Orthopedic Surgery | Admitting: Orthopedic Surgery

## 2023-05-27 ENCOUNTER — Ambulatory Visit (HOSPITAL_COMMUNITY): Payer: 59 | Admitting: Anesthesiology

## 2023-05-27 ENCOUNTER — Encounter (HOSPITAL_COMMUNITY): Payer: Self-pay | Admitting: Orthopedic Surgery

## 2023-05-27 ENCOUNTER — Telehealth: Payer: Self-pay | Admitting: Orthopedic Surgery

## 2023-05-27 DIAGNOSIS — I10 Essential (primary) hypertension: Secondary | ICD-10-CM | POA: Insufficient documentation

## 2023-05-27 DIAGNOSIS — K219 Gastro-esophageal reflux disease without esophagitis: Secondary | ICD-10-CM | POA: Insufficient documentation

## 2023-05-27 DIAGNOSIS — Z6841 Body Mass Index (BMI) 40.0 and over, adult: Secondary | ICD-10-CM | POA: Insufficient documentation

## 2023-05-27 DIAGNOSIS — G5601 Carpal tunnel syndrome, right upper limb: Secondary | ICD-10-CM | POA: Diagnosis not present

## 2023-05-27 DIAGNOSIS — F17200 Nicotine dependence, unspecified, uncomplicated: Secondary | ICD-10-CM | POA: Diagnosis not present

## 2023-05-27 DIAGNOSIS — Z01818 Encounter for other preprocedural examination: Secondary | ICD-10-CM

## 2023-05-27 DIAGNOSIS — G5603 Carpal tunnel syndrome, bilateral upper limbs: Secondary | ICD-10-CM | POA: Insufficient documentation

## 2023-05-27 HISTORY — PX: CARPAL TUNNEL RELEASE: SHX101

## 2023-05-27 LAB — GLUCOSE, CAPILLARY
Glucose-Capillary: 80 mg/dL (ref 70–99)
Glucose-Capillary: 86 mg/dL (ref 70–99)

## 2023-05-27 SURGERY — CARPAL TUNNEL RELEASE
Anesthesia: General | Site: Hand | Laterality: Right

## 2023-05-27 MED ORDER — HYDROCODONE-ACETAMINOPHEN 5-325 MG PO TABS
1.0000 | ORAL_TABLET | Freq: Four times a day (QID) | ORAL | 0 refills | Status: AC | PRN
Start: 2023-05-27 — End: 2023-06-03

## 2023-05-27 MED ORDER — PROPOFOL 10 MG/ML IV BOLUS
INTRAVENOUS | Status: AC
  Filled 2023-05-27: qty 20

## 2023-05-27 MED ORDER — KETOROLAC TROMETHAMINE 30 MG/ML IJ SOLN
INTRAMUSCULAR | Status: AC
  Filled 2023-05-27: qty 1

## 2023-05-27 MED ORDER — ONDANSETRON HCL 4 MG/2ML IJ SOLN
INTRAMUSCULAR | Status: AC
  Filled 2023-05-27: qty 2

## 2023-05-27 MED ORDER — ONDANSETRON HCL 4 MG/2ML IJ SOLN
INTRAMUSCULAR | Status: DC | PRN
  Administered 2023-05-27: 4 mg via INTRAVENOUS

## 2023-05-27 MED ORDER — LIDOCAINE HCL (PF) 2 % IJ SOLN
INTRAMUSCULAR | Status: AC
  Filled 2023-05-27: qty 5

## 2023-05-27 MED ORDER — DEXAMETHASONE SODIUM PHOSPHATE 10 MG/ML IJ SOLN
INTRAMUSCULAR | Status: AC
  Filled 2023-05-27: qty 1

## 2023-05-27 MED ORDER — PROPOFOL 10 MG/ML IV BOLUS
INTRAVENOUS | Status: DC | PRN
  Administered 2023-05-27: 30 mg via INTRAVENOUS
  Administered 2023-05-27 (×2): 20 mg via INTRAVENOUS
  Administered 2023-05-27: 200 mg via INTRAVENOUS

## 2023-05-27 MED ORDER — CEFAZOLIN SODIUM-DEXTROSE 1-4 GM/50ML-% IV SOLN: 1.0000 g | Freq: Once | INTRAVENOUS | Status: DC

## 2023-05-27 MED ORDER — KETOROLAC TROMETHAMINE 30 MG/ML IJ SOLN
INTRAMUSCULAR | Status: DC | PRN
  Administered 2023-05-27: 30 mg via INTRAVENOUS

## 2023-05-27 MED ORDER — BUPIVACAINE HCL (PF) 0.5 % IJ SOLN
INTRAMUSCULAR | Status: AC
  Filled 2023-05-27: qty 30

## 2023-05-27 MED ORDER — CEFAZOLIN SODIUM-DEXTROSE 2-4 GM/100ML-% IV SOLN
2.0000 g | INTRAVENOUS | Status: AC
  Administered 2023-05-27: 2 g via INTRAVENOUS

## 2023-05-27 MED ORDER — ONDANSETRON HCL 4 MG/2ML IJ SOLN
INTRAMUSCULAR | Status: AC
  Filled 2023-05-27: qty 4

## 2023-05-27 MED ORDER — 0.9 % SODIUM CHLORIDE (POUR BTL) OPTIME
TOPICAL | Status: DC | PRN
  Administered 2023-05-27: 500 mL

## 2023-05-27 MED ORDER — BUPIVACAINE HCL (PF) 0.5 % IJ SOLN
INTRAMUSCULAR | Status: DC | PRN
  Administered 2023-05-27: 10 mL

## 2023-05-27 MED ORDER — CEFAZOLIN SODIUM-DEXTROSE 2-4 GM/100ML-% IV SOLN
INTRAVENOUS | Status: AC
  Filled 2023-05-27: qty 100

## 2023-05-27 MED ORDER — ORAL CARE MOUTH RINSE: 15.0000 mL | Freq: Once | OROMUCOSAL | Status: DC

## 2023-05-27 MED ORDER — FENTANYL CITRATE (PF) 100 MCG/2ML IJ SOLN
INTRAMUSCULAR | Status: AC
  Filled 2023-05-27: qty 2

## 2023-05-27 MED ORDER — CHLORHEXIDINE GLUCONATE 0.12 % MT SOLN: 15.0000 mL | Freq: Once | OROMUCOSAL | Status: DC

## 2023-05-27 MED ORDER — FENTANYL CITRATE (PF) 100 MCG/2ML IJ SOLN
INTRAMUSCULAR | Status: DC | PRN
  Administered 2023-05-27 (×4): 50 ug via INTRAVENOUS

## 2023-05-27 MED ORDER — LACTATED RINGERS IV SOLN: INTRAVENOUS | Status: DC

## 2023-05-27 SURGICAL SUPPLY — 48 items
APL PRP STRL LF DISP 70% ISPRP (MISCELLANEOUS) ×1
BANDAGE ESMARK 4X12 BL STRL LF (DISPOSABLE) ×2 IMPLANT
BLADE SURG 15 STRL LF DISP TIS (BLADE) ×2 IMPLANT
BLADE SURG 15 STRL SS (BLADE) ×1
BNDG CMPR 12X4 ELC STRL LF (DISPOSABLE) ×1
BNDG CMPR 5X2 CHSV 1 LYR STRL (GAUZE/BANDAGES/DRESSINGS) ×1
BNDG CMPR STD VLCR NS LF 5.8X2 (GAUZE/BANDAGES/DRESSINGS) ×1
BNDG CMPR STD VLCR NS LF 5.8X3 (GAUZE/BANDAGES/DRESSINGS) ×1
BNDG COHESIVE 2X5 TAN ST LF (GAUZE/BANDAGES/DRESSINGS) ×2 IMPLANT
BNDG ELASTIC 2X5.8 VLCR NS LF (GAUZE/BANDAGES/DRESSINGS) IMPLANT
BNDG ELASTIC 3X5.8 VLCR NS LF (GAUZE/BANDAGES/DRESSINGS) ×2 IMPLANT
BNDG ESMARK 4X12 BLUE STRL LF (DISPOSABLE) ×1
BNDG GAUZE DERMACEA FLUFF 4 (GAUZE/BANDAGES/DRESSINGS) IMPLANT
BNDG GAUZE ELAST 4 BULKY (GAUZE/BANDAGES/DRESSINGS) ×2 IMPLANT
BNDG GZE DERMACEA 4 6PLY (GAUZE/BANDAGES/DRESSINGS) ×1
CHLORAPREP W/TINT 26 (MISCELLANEOUS) ×2 IMPLANT
CLOTH BEACON ORANGE TIMEOUT ST (SAFETY) ×2 IMPLANT
CORD BIPOLAR FORCEPS 12FT (ELECTRODE) ×2 IMPLANT
COVER LIGHT HANDLE STERIS (MISCELLANEOUS) ×4 IMPLANT
CUFF TOURN SGL QUICK 18X4 (TOURNIQUET CUFF) ×2 IMPLANT
DRAPE HALF SHEET 40X57 (DRAPES) ×2 IMPLANT
GAUZE 4X4 16PLY ~~LOC~~+RFID DBL (SPONGE) ×2 IMPLANT
GAUZE SPONGE 4X4 12PLY STRL (GAUZE/BANDAGES/DRESSINGS) ×2 IMPLANT
GAUZE XEROFORM 1X8 LF (GAUZE/BANDAGES/DRESSINGS) ×2 IMPLANT
GLOVE BIO SURGEON STRL SZ8 (GLOVE) ×6 IMPLANT
GLOVE BIOGEL PI IND STRL 7.0 (GLOVE) ×4 IMPLANT
GLOVE SRG 8 PF TXTR STRL LF DI (GLOVE) ×2 IMPLANT
GLOVE SURG UNDER POLY LF SZ8 (GLOVE) ×1
GOWN STRL REUS W/ TWL XL LVL3 (GOWN DISPOSABLE) ×2 IMPLANT
GOWN STRL REUS W/TWL LRG LVL3 (GOWN DISPOSABLE) ×2 IMPLANT
GOWN STRL REUS W/TWL XL LVL3 (GOWN DISPOSABLE) ×1
KIT TURNOVER KIT A (KITS) ×2 IMPLANT
MANIFOLD NEPTUNE II (INSTRUMENTS) ×2 IMPLANT
NDL HYPO 18GX1.5 BLUNT FILL (NEEDLE) ×2 IMPLANT
NDL HYPO 21X1.5 SAFETY (NEEDLE) ×2 IMPLANT
NEEDLE HYPO 18GX1.5 BLUNT FILL (NEEDLE) ×1 IMPLANT
NEEDLE HYPO 21X1.5 SAFETY (NEEDLE) ×1 IMPLANT
NS IRRIG 1000ML POUR BTL (IV SOLUTION) ×2 IMPLANT
PACK BASIC LIMB (CUSTOM PROCEDURE TRAY) ×2 IMPLANT
PAD ARMBOARD 7.5X6 YLW CONV (MISCELLANEOUS) ×2 IMPLANT
POSITIONER HAND ALUMI XLG (MISCELLANEOUS) ×2 IMPLANT
POSITIONER HEAD 8X9X4 ADT (SOFTGOODS) ×2 IMPLANT
SET BASIN LINEN APH (SET/KITS/TRAYS/PACK) ×2 IMPLANT
STRIP CLOSURE SKIN 1/2X4 (GAUZE/BANDAGES/DRESSINGS) IMPLANT
SUT PROLENE NAB BLUE 3-0 30IN (SUTURE) IMPLANT
SYR 20ML LL LF (SYRINGE) ×2 IMPLANT
SYR CONTROL 10ML LL (SYRINGE) ×2 IMPLANT
UNDERPAD 30X36 HEAVY ABSORB (UNDERPADS AND DIAPERS) ×2 IMPLANT

## 2023-05-27 NOTE — Anesthesia Preprocedure Evaluation (Signed)
Anesthesia Evaluation  Patient identified by MRN, date of birth, ID band Patient awake    Reviewed: Allergy & Precautions, H&P , NPO status , Patient's Chart, lab work & pertinent test results, reviewed documented beta blocker date and time   Airway Mallampati: II  TM Distance: >3 FB Neck ROM: full    Dental no notable dental hx.    Pulmonary neg pulmonary ROS, Current Smoker   Pulmonary exam normal breath sounds clear to auscultation       Cardiovascular Exercise Tolerance: Good hypertension, negative cardio ROS  Rhythm:regular Rate:Normal     Neuro/Psych  PSYCHIATRIC DISORDERS Anxiety Depression Bipolar Disorder    Neuromuscular disease negative neurological ROS  negative psych ROS   GI/Hepatic negative GI ROS, Neg liver ROS,GERD  ,,  Endo/Other    Morbid obesity  Renal/GU negative Renal ROS  negative genitourinary   Musculoskeletal   Abdominal   Peds  Hematology negative hematology ROS (+)   Anesthesia Other Findings   Reproductive/Obstetrics negative OB ROS                             Anesthesia Physical Anesthesia Plan  ASA: 3  Anesthesia Plan: General and General LMA   Post-op Pain Management:    Induction:   PONV Risk Score and Plan: Ondansetron  Airway Management Planned:   Additional Equipment:   Intra-op Plan:   Post-operative Plan:   Informed Consent: I have reviewed the patients History and Physical, chart, labs and discussed the procedure including the risks, benefits and alternatives for the proposed anesthesia with the patient or authorized representative who has indicated his/her understanding and acceptance.     Dental Advisory Given  Plan Discussed with: CRNA  Anesthesia Plan Comments:        Anesthesia Quick Evaluation

## 2023-05-27 NOTE — Interval H&P Note (Signed)
History and Physical Interval Note:  05/27/2023 7:21 AM  Andrea Watts  has presented today for surgery, with the diagnosis of RIGHT CARPAL TUNNEL SYNDROME.  The various methods of treatment have been discussed with the patient and family. After consideration of risks, benefits and other options for treatment, the patient has consented to  Procedure(s): RIGHT CARPAL TUNNEL RELEASE (Right) as a surgical intervention.  The patient's history has been reviewed, patient examined, no change in status, stable for surgery.  I have reviewed the patient's chart and labs.  Questions were answered to the patient's satisfaction.    Patient confirmed, right carpal tunnel release.  Consent has been finalized.   Oliver Barre

## 2023-05-27 NOTE — Transfer of Care (Signed)
Immediate Anesthesia Transfer of Care Note  Patient: Andrea Watts  Procedure(s) Performed: RIGHT CARPAL TUNNEL RELEASE (Right: Hand)  Patient Location: PACU  Anesthesia Type:General  Level of Consciousness: awake and patient cooperative  Airway & Oxygen Therapy: Patient Spontanous Breathing  Post-op Assessment: Report given to RN and Post -op Vital signs reviewed and stable  Post vital signs: Reviewed and stable  Last Vitals:  Vitals Value Taken Time  BP 123/80 05/27/23 0834  Temp 97.5 05/27/23  0834  Pulse 77 05/27/23 0833  Resp 19 05/27/23 0833  SpO2 98 % 05/27/23 0833  Vitals shown include unfiled device data.  Last Pain:  Vitals:   05/27/23 0648  TempSrc: Oral  PainSc: 5       Patients Stated Pain Goal: 6 (05/27/23 1610)  Complications: No notable events documented.

## 2023-05-27 NOTE — Anesthesia Procedure Notes (Signed)
Procedure Name: LMA Insertion Date/Time: 05/27/2023 7:36 AM  Performed by: Franco Nones, CRNAPre-anesthesia Checklist: Patient identified, Emergency Drugs available, Suction available, Timeout performed and Patient being monitored Patient Re-evaluated:Patient Re-evaluated prior to induction Preoxygenation: Pre-oxygenation with 100% oxygen Induction Type: IV induction Ventilation: Mask ventilation without difficulty LMA: LMA inserted LMA Size: 4.0 Number of attempts: 1 Placement Confirmation: positive ETCO2 and breath sounds checked- equal and bilateral Tube secured with: Tape Dental Injury: Teeth and Oropharynx as per pre-operative assessment

## 2023-05-27 NOTE — Anesthesia Postprocedure Evaluation (Signed)
Anesthesia Post Note  Patient: Andrea Watts  Procedure(s) Performed: RIGHT CARPAL TUNNEL RELEASE (Right: Hand)  Patient location during evaluation: Phase II Anesthesia Type: General Level of consciousness: awake Pain management: pain level controlled Vital Signs Assessment: post-procedure vital signs reviewed and stable Respiratory status: spontaneous breathing and respiratory function stable Cardiovascular status: blood pressure returned to baseline and stable Postop Assessment: no headache and no apparent nausea or vomiting Anesthetic complications: no Comments: Late entry   No notable events documented.   Last Vitals:  Vitals:   05/27/23 0845 05/27/23 0900  BP: (!) 141/89 (!) 140/78  Pulse: 71 66  Resp: 18 18  Temp:  (!) 36.4 C  SpO2: 94% 97%    Last Pain:  Vitals:   05/27/23 0900  TempSrc: Axillary  PainSc: 0-No pain                 Windell Norfolk

## 2023-05-27 NOTE — Telephone Encounter (Signed)
Dr. Dallas Schimke pt - [facility] [phone] lvm stating they received a script for Andrea Watts 2071/04/04 for Hydrocodone 5-325, pt is on Buprenorphine therapy, wants to know what you want them to do.

## 2023-05-27 NOTE — Telephone Encounter (Signed)
I called to advise.  

## 2023-05-27 NOTE — Discharge Instructions (Signed)
°  Andrea Gautier [name], MD MS [name] [address] ,  [place]  [address] Phone: [phone] Fax: [phone]    POST-OPERATIVE INSTRUCTIONS   WOUND CARE You may remove your bandage on postop day 3 and get the hand wet.  No ointments or lotions to be applied to the incision.  Do not submerge the incision for 1 month.  FOLLOW-UP If you develop a Fever (>101.5), Redness or Drainage from the surgical incision site, please call our office to arrange for an evaluation. Please call the office to schedule a follow-up appointment for your incision check if you do not already have one, 7-10 days post-operatively.  IF YOU HAVE ANY QUESTIONS, PLEASE FEEL FREE TO CALL OUR OFFICE.  HELPFUL INFORMATION  You should wean off your narcotic medicines as soon as you are able.  Most patients will be off or using minimal narcotics before their first postop appointment.   You may be more comfortable sleeping in a semi-seated position the first few nights following surgery.  Keep a pillow propped under the elbow and forearm for comfort.  If you have a recliner type of chair it might be beneficial.    We suggest you use the pain medication the first night prior to going to bed, in order to ease any pain when the anesthesia wears off. You should avoid taking pain medications on an empty stomach as it will make you nauseous.  Do not drink alcoholic beverages or take illicit drugs when taking pain medications.  You may return to work/school in the next couple of days when you feel up to it. Desk work and typing is fine.  Pain medication may make you constipated.  Below are a few solutions to try in this order: Decrease the amount of pain medication if you aren't having pain. Drink lots of decaffeinated fluids. Drink prune juice and/or each dried prunes  If the first 3 don't work start with additional solutions Take Colace - an over-the-counter stool softener Take  Senokot - an over-the-counter laxative Take Miralax - a stronger over-the-counter laxative    

## 2023-05-27 NOTE — H&P (Signed)
Below is the most recent clinic note for Andrea Watts; any pertinent information regarding their recent medical history will be updated on the day of surgery.  She has elected to proceed with surgery on her right hand first.  Consent has been updated.  She is aware and in agreement with the plan.   Orthopaedic Clinic Return  Assessment: Andrea Watts is a 52 y.o. female with the following: Left carpal tunnel syndrome  Plan: Andrea Watts continues to have pain, numbness and tingling in the median nerve distribution of bilateral hands.  More specifically she has pain within the thenar eminence bilaterally.  Symptoms in the left are currently worse than the right.  EMGs completed by Dr. Ezzard Standing demonstrate moderate to severe symptoms bilaterally.  We discussed open carpal tunnel release, and all questions were answered.  She is interested in proceeding with surgery in her left hand.  She will work to obtain medical clearance, and her case will be posted accordingly.  Risks and benefits of the surgery, including, but not limited to infection, bleeding, persistent pain, need for further surgery, recurrence of symptoms and more severe complications associated with anesthesia were discussed with the patient.  The patient has elected to proceed.   Follow-up: No follow-ups on file.   Subjective:  No chief complaint on file.   History of Present Illness: Andrea Watts is a 52 y.o. female who returns to clinic for repeat evaluation of bilateral hand pain.  She has pain, numbness and tingling in the median nerve distribution bilaterally.  Left hand is currently worse than the right.  She has radiating pains into the thenar eminence bilaterally.  She has previously attempted bracing and medications without improvement in her symptoms.  She has had EMGs, and is here to discuss the findings.  Review of Systems: No fevers or chills + numbness & tingling No chest pain No shortness of  breath No bowel or bladder dysfunction No GI distress No headaches   Objective: BP 118/72   Pulse 70   Temp 98 F (36.7 C) (Oral)   Resp 20   Ht 5\' 7"  (1.702 m)   Wt 128.4 kg   LMP 07/07/2017   SpO2 96%   BMI 44.34 kg/m   Physical Exam:  Alert and oriented.  No acute distress.  Evaluation of bilateral hands demonstrates no deformity.  No atrophy of the thenar eminence bilaterally.  Positive Tinel's bilaterally.  Positive Phalen's bilaterally.  Positive Durkan's.  IMAGING: I personally ordered and reviewed the following images:  No new imaging obtained today.  EMG results demonstrate moderate to severe carpal tunnel syndrome bilaterally, right slightly worse than left   Oliver Barre, MD 05/27/2023 7:20 AM

## 2023-05-27 NOTE — Op Note (Signed)
Orthopaedic Surgery Operative Note (CSN: [id number])  Andrea Watts  26-Jun-1971 Date of Surgery: 05/27/2023   Diagnoses:  RIGHT CARPAL TUNNEL SYNDROME  Procedure: Right Open Carpal Tunnel Release   Operative Finding Successful completion of the planned procedure.    Post-Op Diagnosis: Same Surgeons:Primary: Oliver Barre, MD Assistants: None Location: [facility] OR ROOM 4 Anesthesia: General with local anesthesia Antibiotics: Ancef 2 g Tourniquet time:  Total Tourniquet Time Documented: Upper Arm (Right) - 24 minutes Total: Upper Arm (Right) - 24 minutes  Estimated Blood Loss: 5 cc Complications: None Specimens: None  Implants: None  Indications for Surgery:   Andrea Watts is a 52 y.o. female with symptoms consistent with carpal tunnel syndrome.  Symptoms have been ongoing, and progressively worsening.  They have tried medications and bracing without improvement in symptoms.  EMG results demonstrate moderate to severe carpal tunnel syndrome.  Risks and benefits of operative and nonoperative management were discussed prior to surgery with the patient and informed consent form was completed.  Specific risks including infection, need for additional surgery, bleeding, recurrent symptoms, incomplete resolution of symptoms, persistent pain and more severe complications associated with anesthesia were discussed.  All questions were answered.  Surgical consent was finalized.    Procedure:   The patient was identified properly. Informed consent was finalized and the surgical site was marked. The patient was taken to the OR where general anesthesia was induced.  The patient was positioned supine, on a hand table.  The right arm was prepped and draped in the usual sterile fashion.  Timeout was performed before the beginning of the case.  Tourniquet was used for the above duration.  Antibiotics were administered prior to making incision.  Incision was made in line with the radial border of  the ring finger. The carpal tunnel transverse fascia was identified, cleaned, and incised sharply. The common sensory branches were visualized along with the superficial palmar arch and protected.  The median nerve was protected below. Deep retractors were placed underneath the transverse carpal ligament, protecting the nerve. I released the ligament completely, and then released the distal volar forearm fascia. The nerve was identified, and visualized, and protected throughout the case. No masses or abnormalities were identified in ulnar bursa.  The wounds were irrigated copiously, and the wounds injected. Skin closed with interrupted sutures followed by a bulky dressing. Patient  tolerated this well, with no complications.   Post-operative plan:  The patient will be discharged home from the PACU. WBAT on the operative extremity; limit lifting to nothing more than a coffee cup until follow up appointment   DVT prophylaxis not indicated in this ambulatory upper extremity patient without significant risk factors.    Pain control with PRN pain medication preferring oral medicines.   Follow up plan will be scheduled in approximately 7-10 days for incision check

## 2023-05-29 ENCOUNTER — Encounter (HOSPITAL_COMMUNITY): Payer: Self-pay | Admitting: Orthopedic Surgery

## 2023-06-07 ENCOUNTER — Ambulatory Visit (INDEPENDENT_AMBULATORY_CARE_PROVIDER_SITE_OTHER): Payer: 59 | Admitting: Orthopedic Surgery

## 2023-06-07 ENCOUNTER — Encounter: Payer: Self-pay | Admitting: Orthopedic Surgery

## 2023-06-07 DIAGNOSIS — G5601 Carpal tunnel syndrome, right upper limb: Secondary | ICD-10-CM | POA: Diagnosis not present

## 2023-06-07 NOTE — Progress Notes (Signed)
Orthopaedic Postop Note  Assessment: Andrea Watts is a 52 y.o. female s/p right open carpal tunnel release  DOS: 05/27/2023  Plan: Andrea Watts has done well.  Denies burning or shooting pains.  No numbness or tingling in the median nerve distribution.  Surgical incision is healing well.  Sutures were removed, and Steri-Strips were placed.  Okay to return to work.  Keep incision covered.  Do not submerge the wound.  Return in 4 weeks.    Follow-up: Return in about 4 weeks (around 07/05/2023). XR at next visit: None  Subjective:  Chief Complaint  Patient presents with   Carpal Tunnel    Here for incision check    History of Present Illness: Andrea Watts is a 52 y.o. female who presents following the above stated procedure.  Surgery was approximately 10 days ago.  No issues.  Some pain for the first few days.  Burning and shooting pains have almost completely resolved.  No numbness in the fingers.   No issues with the incision  Review of Systems: No fevers or chills Some numbness or tingling No Chest Pain No shortness of breath   Objective: LMP 07/07/2017   Physical Exam:  Alert and oriented, no acute distress.  Surgical incision is healing well.  No surrounding erythema or drainage.  Mild tenderness to palpation about surgical site.  Sensation is intact in the median nerve distribution.  Able to make a full fist. No redness. 2+ radial pulse.   IMAGING: I personally ordered and reviewed the following images:  No new imaging obtained today  Oliver Barre, MD 06/07/2023 9:57 AM

## 2023-06-16 ENCOUNTER — Encounter: Payer: Self-pay | Admitting: Pharmacist

## 2023-06-16 NOTE — Progress Notes (Signed)
Pharmacy Quality Measure Review  This patient is appearing on a report for being at risk of failing the adherence measure for diabetes medications this calendar year.   Medication: Mounjaro 5 mg Last fill date: 10/15 for 28 day supply  Insurance report was not up to date. No action needed at this time.   Jarrett Ables, PharmD PGY-1 Pharmacy Resident

## 2023-06-27 ENCOUNTER — Ambulatory Visit: Payer: Self-pay | Admitting: Obstetrics & Gynecology

## 2023-07-05 ENCOUNTER — Encounter: Payer: 59 | Admitting: Orthopedic Surgery

## 2023-07-10 ENCOUNTER — Encounter: Payer: 59 | Admitting: Orthopedic Surgery

## 2023-09-17 ENCOUNTER — Other Ambulatory Visit (HOSPITAL_COMMUNITY)
Admission: RE | Admit: 2023-09-17 | Discharge: 2023-09-17 | Disposition: A | Discharge status: Home / Self Care | Payer: 59 | Source: Ambulatory Visit | Attending: Obstetrics & Gynecology | Admitting: Obstetrics & Gynecology

## 2023-09-17 ENCOUNTER — Encounter: Payer: Self-pay | Admitting: Obstetrics & Gynecology

## 2023-09-17 ENCOUNTER — Ambulatory Visit: Payer: 59 | Admitting: Obstetrics & Gynecology

## 2023-09-17 VITALS — BP 115/73 | HR 83 | Ht 66.0 in | Wt 287.0 lb

## 2023-09-17 DIAGNOSIS — Z1151 Encounter for screening for human papillomavirus (HPV): Secondary | ICD-10-CM | POA: Diagnosis not present

## 2023-09-17 DIAGNOSIS — N951 Menopausal and female climacteric states: Secondary | ICD-10-CM | POA: Diagnosis not present

## 2023-09-17 DIAGNOSIS — Z01419 Encounter for gynecological examination (general) (routine) without abnormal findings: Secondary | ICD-10-CM

## 2023-09-17 NOTE — Progress Notes (Signed)
WELL-WOMAN EXAMINATION Patient name: Andrea Watts MRN [id number]  Date of birth: 12-24-1970 Chief Complaint:   Gynecologic Exam  History of Present Illness:   Andrea Watts is a 53 y.o. G0P0000 PM female being seen today for a routine well-woman exam.    Notes hot flashes and mood changes.  Denies night sweats.  Rates her symptoms 6/10  Denies vaginal discharge, itching or irritation.  She does note occasional pelvic pain just some lower cramping maybe once or twice a month but not regularly  Patient's last menstrual period was 07/07/2017.  Last pap due today.  Last mammogram: 07/2022- as scheduled Last colonoscopy: plans for colonoscopy     09/17/2023    3:24 PM 04/25/2021    9:48 AM 12/12/2020    3:12 PM 09/27/2020    3:07 PM  Depression screen PHQ 2/9  Decreased Interest 1 0 0 3  Down, Depressed, Hopeless 1 0 0 3  PHQ - 2 Score 2 0 0 6  Altered sleeping 1   2  Tired, decreased energy 1   2  Change in appetite 0   0  Feeling bad or failure about yourself  1   1  Trouble concentrating 0   0  Moving slowly or fidgety/restless 0   1  Suicidal thoughts 0   0  PHQ-9 Score 5   12  Difficult doing work/chores    Somewhat difficult      Review of Systems:   Pertinent items are noted in HPI Denies any headaches, blurred vision, fatigue, shortness of breath, chest pain, abdominal pain, bowel movements, urination, or intercourse unless otherwise stated above.  Pertinent History Reviewed:  Reviewed past medical,surgical, social and family history.  Reviewed problem list, medications and allergies. Physical Assessment:   Vitals:   09/17/23 1526  BP: 115/73  Pulse: 83  Weight: 287 lb (130.2 kg)  Height: 5\' 6"  (1.676 m)  Body mass index is 46.32 kg/m.        Physical Examination:   General appearance - well appearing, and in no distress  Mental status - alert, oriented to person, place, and time  Psych:  She has a normal mood and affect  Skin - warm and dry,  normal color, no suspicious lesions noted  Chest - effort normal, all lung fields clear to auscultation bilaterally  Heart - normal rate and regular rhythm  Neck:  midline trachea, no thyromegaly or nodules  Breasts - breasts appear normal, no suspicious masses, no skin or nipple changes or  axillary nodes  Abdomen - obese, soft, nontender, nondistended, no masses or organomegaly  Pelvic - VULVA: normal appearing vulva with no masses, tenderness or lesions  VAGINA: normal appearing vagina with normal color and discharge, no lesions  CERVIX: normal appearing cervix without discharge or lesions, no CMT  Thin prep pap is done with HR HPV cotesting  UTERUS: uterus is felt to be normal size, shape, consistency and nontender   ADNEXA: No adnexal masses or tenderness noted- limited exam due to body habitus  Extremities:  No swelling or varicosities noted  Chaperone: Faith Rogue     Assessment & Plan:  1) Well-Woman Exam -Pap did, reviewed ASCCP guidelines -Mammogram scheduled for next week -Patient plans to schedule colonoscopy  2) vasomotor symptoms -Reviewed conservative management options including healthy lifestyle, exercise -Recommended herbal supplement and given options x 3 months -Discussed HRT reviewed WHI study and discussed risk benefits as well as goal of "lowest dose for shortest timeframe " -  Also discussed [place] -Patient to follow-up should this continue to be a persistent problem  3) Pelvic pain -only on occasion, mild, did not require medication.  No abnormalities noted on exam []  should this worsen or continue, pt to call in to office for pelvic US  No orders of the defined types were placed in this encounter.   Meds: No orders of the defined types were placed in this encounter.   Follow-up: Return in about 1 year (around 09/16/2024) for Annual.   Myna Hidalgo, DO Attending Obstetrician & Gynecologist, [facility] for [facility], [facility]

## 2023-09-20 LAB — CYTOLOGY - PAP
Adequacy: ABSENT
Comment: NEGATIVE
Diagnosis: NEGATIVE
High risk HPV: NEGATIVE

## 2023-09-23 ENCOUNTER — Encounter: Payer: Self-pay | Admitting: Obstetrics & Gynecology

## 2023-10-04 ENCOUNTER — Other Ambulatory Visit (HOSPITAL_COMMUNITY): Payer: Self-pay | Admitting: Family Medicine

## 2023-10-04 DIAGNOSIS — Z1231 Encounter for screening mammogram for malignant neoplasm of breast: Secondary | ICD-10-CM

## 2023-10-10 ENCOUNTER — Ambulatory Visit: Payer: 59 | Admitting: Internal Medicine

## 2023-10-10 ENCOUNTER — Encounter (INDEPENDENT_AMBULATORY_CARE_PROVIDER_SITE_OTHER): Payer: Self-pay

## 2023-10-11 ENCOUNTER — Telehealth: Payer: Self-pay | Admitting: Internal Medicine

## 2023-10-11 NOTE — Telephone Encounter (Signed)
 Left a message to reschedule pt from 2/20 with Dr. Marletta Lor

## 2023-10-14 ENCOUNTER — Inpatient Hospital Stay (HOSPITAL_COMMUNITY): Admission: RE | Admit: 2023-10-14 | Payer: 59 | Source: Ambulatory Visit

## 2023-10-14 DIAGNOSIS — Z1231 Encounter for screening mammogram for malignant neoplasm of breast: Secondary | ICD-10-CM

## 2023-11-27 IMAGING — MR MR LUMBAR SPINE W/O CM
5 series · 31 of 48 positions shown · non-contrast
Comparison: No prior MRI, correlation is made with lumbar spine
radiographs 05/05/2021

CLINICAL DATA: Back pain, which radiates to buttocks and bilateral
lower extremities

EXAM:
MRI LUMBAR SPINE WITHOUT CONTRAST
TECHNIQUE: Multiplanar, multisequence MR imaging of the lumbar spine was
performed. No intravenous contrast was administered.

[Series 5: T2 · sagittal · 4.0mm · 0.78mm/px · 6 of 15 slices shown (1 of 2)]
[im 1/15]
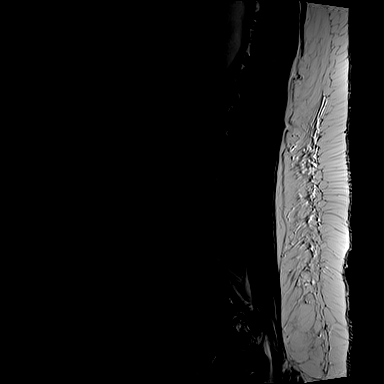
[im 3/15]
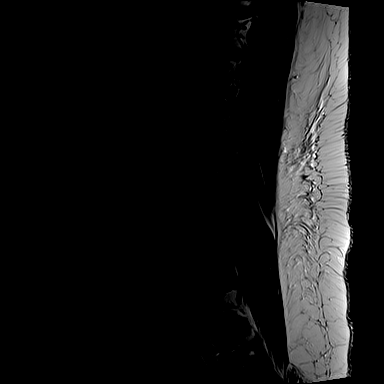
[im 6/15]
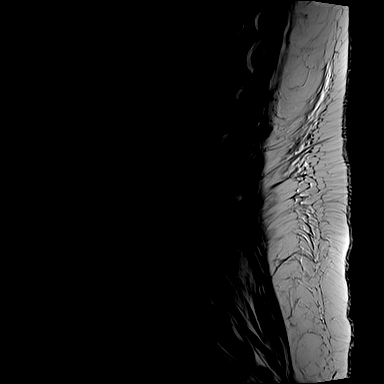
[im 9/15]
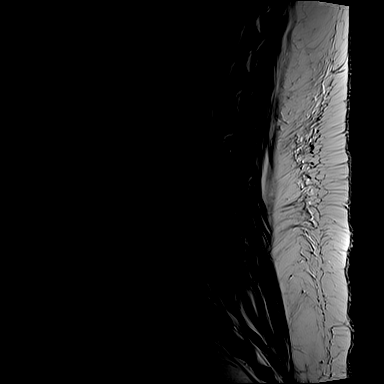
[im 12/15]
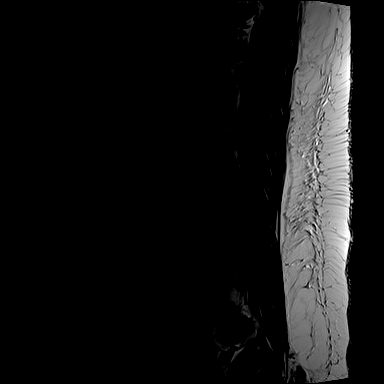
[im 15/15]
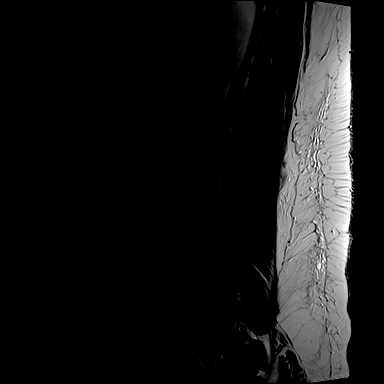

[Series 6: T1 · sagittal · 4.0mm · 0.94mm/px · 7 of 15 slices shown (1 of 2)]
[im 1/15]
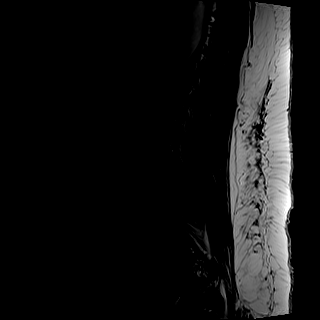
[im 3/15]
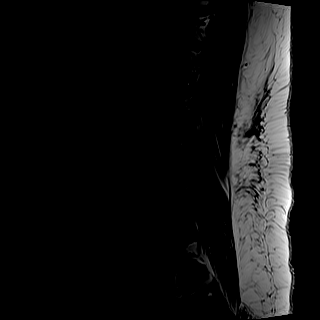
[im 5/15]
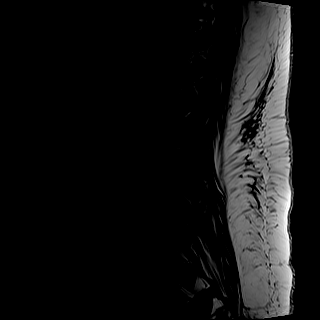
[im 8/15]
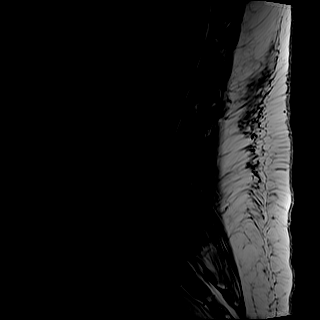
[im 10/15]
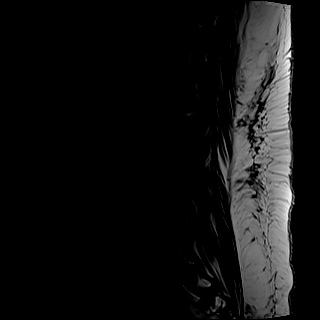
[im 12/15]
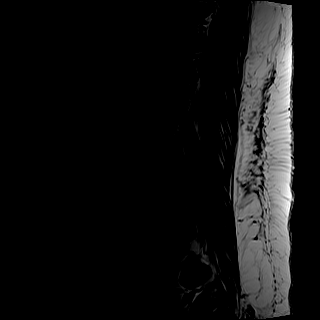
[im 15/15]
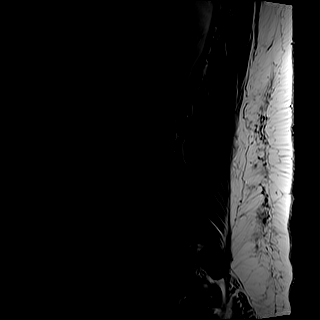

[Series 7: STIR · sagittal · 4.0mm · 0.59mm/px · 2 of 15 slices shown]
[im 1/15]
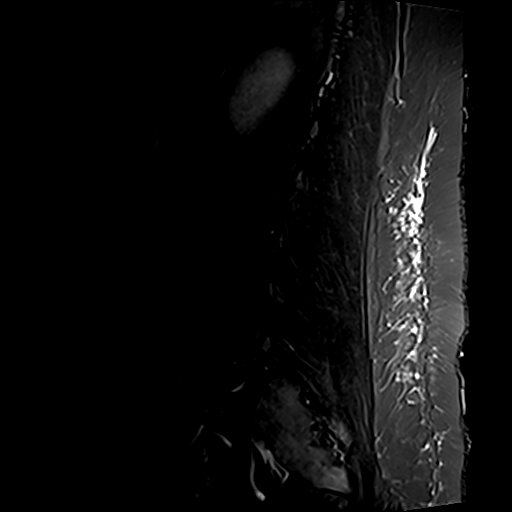
[im 3/15]
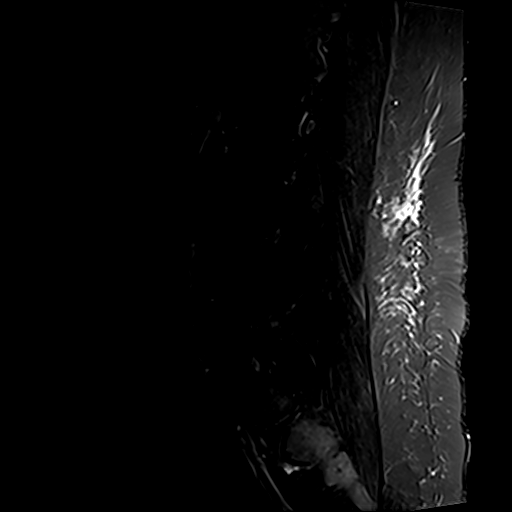

[Series 8: T2 · axial · 4.0mm · 0.70mm/px · z∈[-16,+170]mm · 8 of 32 slices shown (2 of 2)]
[im 1/32]
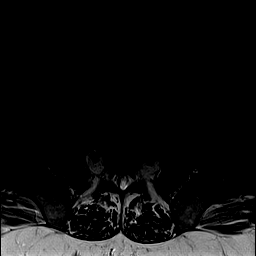
[im 5/32]
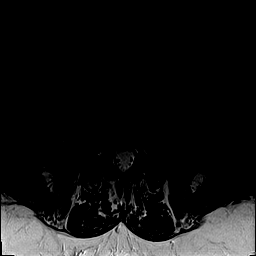
[im 10/32]
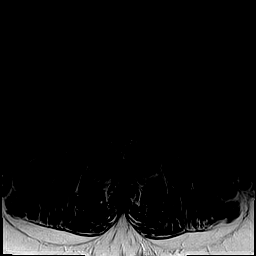
[im 15/32]
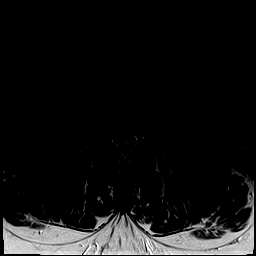
[im 17/32]
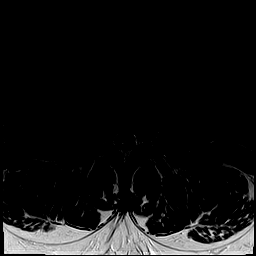
[im 22/32]
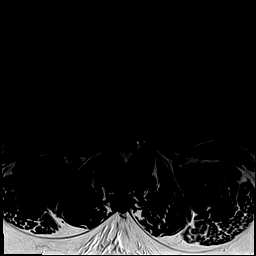
[im 27/32]
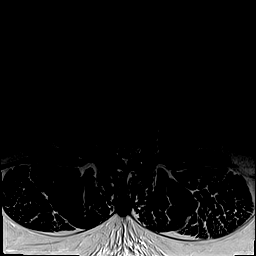
[im 32/32]
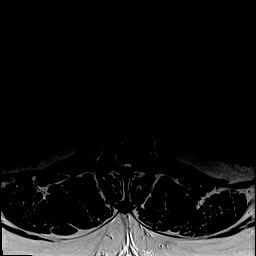

[Series 9: T1 · axial · 4.0mm · 0.35mm/px · z∈[-16,+170]mm · 8 of 32 slices shown (2 of 2)]
[im 1/32]
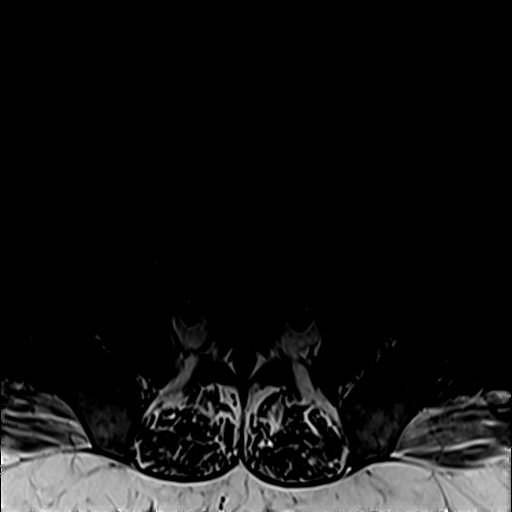
[im 5/32]
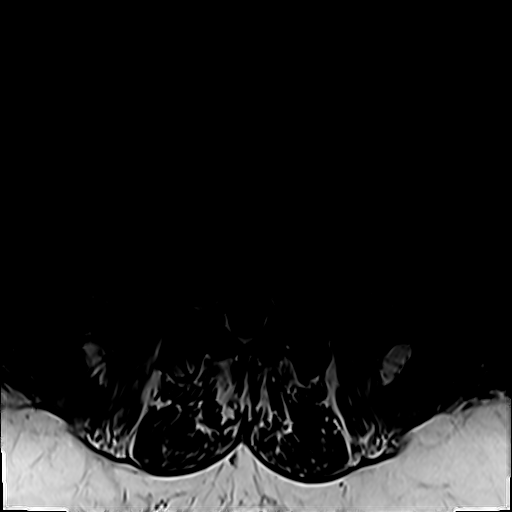
[im 10/32]
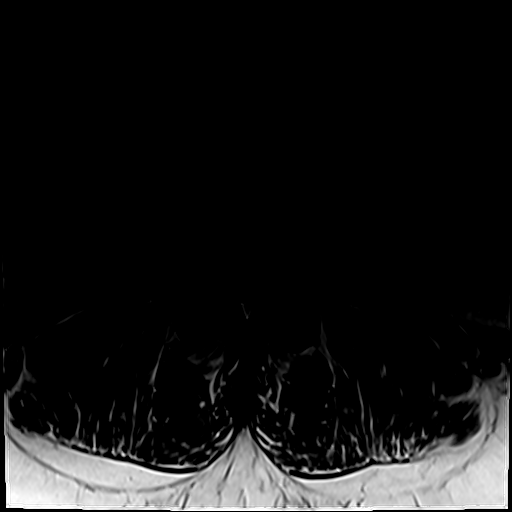
[im 15/32]
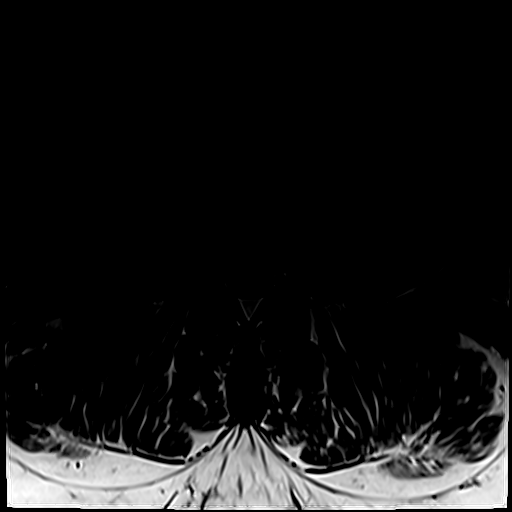
[im 17/32]
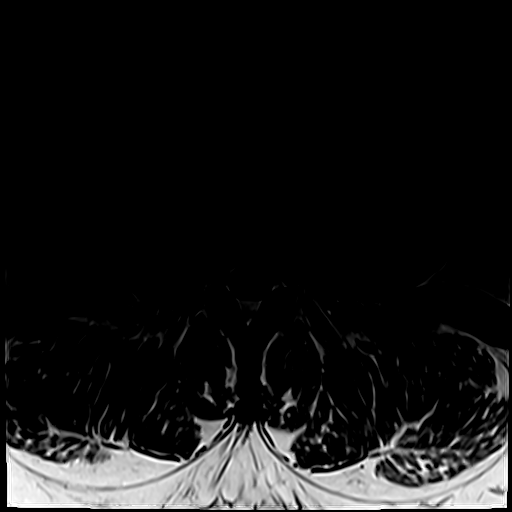
[im 22/32]
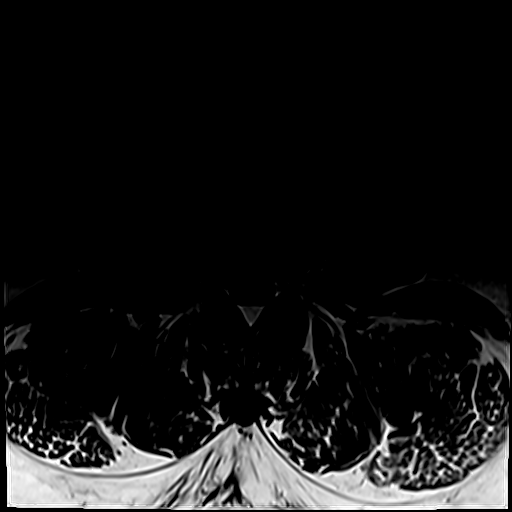
[im 27/32]
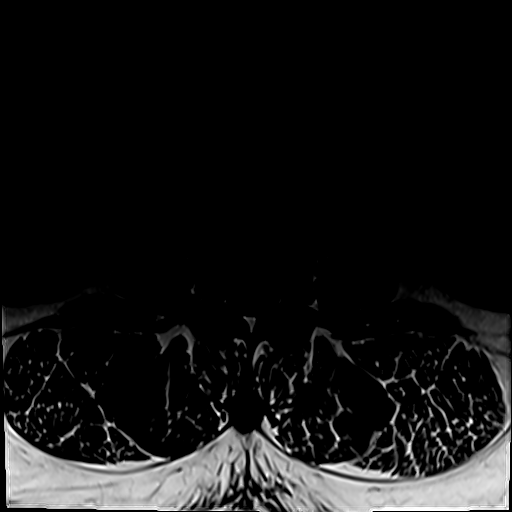
[im 32/32]
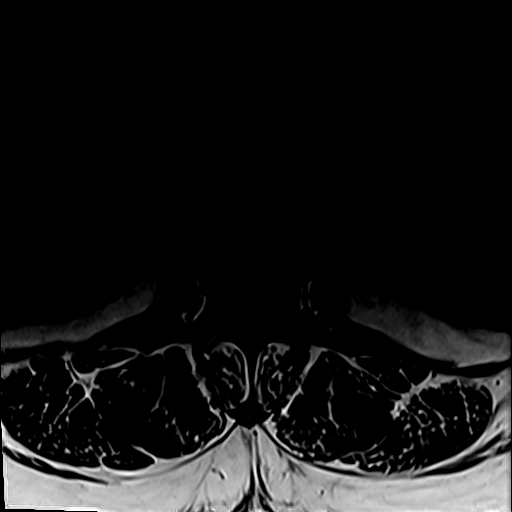

[31 of 48 positions shown; findings below may reference images not displayed]

FINDINGS: Segmentation:  Standard.

Alignment:  Grade 1 anterolisthesis L5 on S1.

Vertebrae:  No acute fracture or suspicious osseous lesion.

Conus medullaris and cauda equina: Conus extends to the L1-L2 level.
Conus and cauda equina appear normal.

Paraspinal and other soft tissues: Negative.

Disc levels:

T12-L1: Seen only on the sagittal images. No significant disc bulge,
spinal canal stenosis, or neural foraminal narrowing.

L1-L2: No significant disc bulge. No spinal canal stenosis or neural
foraminal narrowing.

L2-L3: No significant disc bulge. Mild facet arthropathy. No spinal
canal stenosis or neural foraminal narrowing.

L3-L4: No significant disc bulge. No spinal canal stenosis or neural
foraminal narrowing.

L4-L5: Mild disc bulge with superimposed left foraminal protrusion.
Mild left-greater-than-right facet arthropathy. No spinal canal
stenosis. No neural foraminal narrowing.

L5-S1: Grade 1 anterolisthesis with disc unroofing and broad-based
disc bulge. Moderate facet arthropathy. No spinal canal stenosis or
neural foraminal narrowing.
IMPRESSION: Mild degenerative changes without spinal canal stenosis or neural
foraminal narrowing.

## 2023-12-04 ENCOUNTER — Other Ambulatory Visit: Payer: Self-pay

## 2023-12-04 ENCOUNTER — Emergency Department (HOSPITAL_COMMUNITY)
Admission: EM | Admit: 2023-12-04 | Discharge: 2023-12-04 | Disposition: A | Discharge status: Home / Self Care | Attending: Emergency Medicine | Admitting: Emergency Medicine

## 2023-12-04 ENCOUNTER — Emergency Department (HOSPITAL_COMMUNITY)

## 2023-12-04 DIAGNOSIS — M545 Low back pain, unspecified: Secondary | ICD-10-CM

## 2023-12-04 DIAGNOSIS — R109 Unspecified abdominal pain: Secondary | ICD-10-CM | POA: Diagnosis present

## 2023-12-04 LAB — CBC WITH DIFFERENTIAL/PLATELET
Abs Immature Granulocytes: 0.05 10*3/uL (ref 0.00–0.07)
Basophils Absolute: 0.1 10*3/uL (ref 0.0–0.1)
Basophils Relative: 1 %
Eosinophils Absolute: 0.3 10*3/uL (ref 0.0–0.5)
Eosinophils Relative: 2 %
HCT: 41.6 % (ref 36.0–46.0)
Hemoglobin: 13.7 g/dL (ref 12.0–15.0)
Immature Granulocytes: 0 %
Lymphocytes Relative: 37 %
Lymphs Abs: 4.8 10*3/uL — ABNORMAL HIGH (ref 0.7–4.0)
MCH: 29.8 pg (ref 26.0–34.0)
MCHC: 32.9 g/dL (ref 30.0–36.0)
MCV: 90.4 fL (ref 80.0–100.0)
Monocytes Absolute: 0.9 10*3/uL (ref 0.1–1.0)
Monocytes Relative: 7 %
Neutro Abs: 7.1 10*3/uL (ref 1.7–7.7)
Neutrophils Relative %: 53 %
Platelets: 419 10*3/uL — ABNORMAL HIGH (ref 150–400)
RBC: 4.6 MIL/uL (ref 3.87–5.11)
RDW: 12.1 % (ref 11.5–15.5)
WBC: 13.1 10*3/uL — ABNORMAL HIGH (ref 4.0–10.5)
nRBC: 0 % (ref 0.0–0.2)

## 2023-12-04 LAB — COMPREHENSIVE METABOLIC PANEL WITH GFR
ALT: 94 U/L — ABNORMAL HIGH (ref 0–44)
AST: 58 U/L — ABNORMAL HIGH (ref 15–41)
Albumin: 2.9 g/dL — ABNORMAL LOW (ref 3.5–5.0)
Alkaline Phosphatase: 200 U/L — ABNORMAL HIGH (ref 38–126)
Anion gap: 12 (ref 5–15)
BUN: 14 mg/dL (ref 6–20)
CO2: 25 mmol/L (ref 22–32)
Calcium: 9.2 mg/dL (ref 8.9–10.3)
Chloride: 97 mmol/L — ABNORMAL LOW (ref 98–111)
Creatinine, Ser: 0.54 mg/dL (ref 0.44–1.00)
GFR, Estimated: >60 mL/min (ref >60)
Glucose, Bld: 158 mg/dL — ABNORMAL HIGH (ref 70–99)
Potassium: 3.9 mmol/L (ref 3.5–5.1)
Sodium: 134 mmol/L — ABNORMAL LOW (ref 135–145)
Total Bilirubin: 0.4 mg/dL (ref 0.0–1.2)
Total Protein: 7.1 g/dL (ref 6.5–8.1)

## 2023-12-04 LAB — URINALYSIS, ROUTINE W REFLEX MICROSCOPIC
Bilirubin Urine: NEGATIVE
Glucose, UA: NEGATIVE mg/dL
Hgb urine dipstick: NEGATIVE
Ketones, ur: NEGATIVE mg/dL
Leukocytes,Ua: NEGATIVE
Nitrite: NEGATIVE
Protein, ur: NEGATIVE mg/dL
Specific Gravity, Urine: 1.019 (ref 1.005–1.030)
pH: 5 (ref 5.0–8.0)

## 2023-12-04 MED ORDER — ACETAMINOPHEN 500 MG PO TABS: 1000.0000 mg | ORAL_TABLET | Freq: Once | ORAL | Status: DC

## 2023-12-04 MED ORDER — METHOCARBAMOL 500 MG PO TABS
500.0000 mg | ORAL_TABLET | Freq: Once | ORAL | Status: AC
  Administered 2023-12-04: 500 mg via ORAL
  Filled 2023-12-04: qty 1

## 2023-12-04 MED ORDER — LIDOCAINE 5 % EX PTCH
1.0000 | MEDICATED_PATCH | CUTANEOUS | Status: DC
  Administered 2023-12-04: 1 via TRANSDERMAL
  Filled 2023-12-04: qty 1

## 2023-12-04 MED ORDER — NAPROXEN 500 MG PO TABS: 500.0000 mg | ORAL_TABLET | Freq: Two times a day (BID) | ORAL | 0 refills | Status: DC

## 2023-12-04 MED ORDER — KETOROLAC TROMETHAMINE 15 MG/ML IJ SOLN
15.0000 mg | Freq: Once | INTRAMUSCULAR | Status: AC
  Administered 2023-12-04: 15 mg via INTRAMUSCULAR
  Filled 2023-12-04: qty 1

## 2023-12-04 MED ORDER — OXYCODONE HCL 5 MG PO TABS
5.0000 mg | ORAL_TABLET | Freq: Once | ORAL | Status: AC
  Administered 2023-12-04: 5 mg via ORAL
  Filled 2023-12-04: qty 1

## 2023-12-04 MED ORDER — METHOCARBAMOL 500 MG PO TABS: 500.0000 mg | ORAL_TABLET | Freq: Four times a day (QID) | ORAL | 0 refills | Status: DC | PRN

## 2023-12-04 NOTE — ED Notes (Signed)
 Patient transported to CT

## 2023-12-04 NOTE — ED Triage Notes (Signed)
 Pt reports:  Back cramps Started tonight Recent UTI on 4/11 Completed ABX yesterday Onset sudden

## 2023-12-04 NOTE — Discharge Instructions (Addendum)
 You are seen in the ER for back pain today, your white blood cell count was slightly elevated but there is no signs of UTI, your CT scan showed some likely fibroids in your uterus which are not dangerous, did not show any cause for your back pain.  Your liver functions are slightly elevated but similar to previous.  Follow-up with your primary care doctor, come back to the ER if you have new or worsening symptoms, especially severe pain, fever, vomiting or other worrisome changes.

## 2023-12-04 NOTE — ED Provider Notes (Signed)
 Andrea Watts Provider Note   CSN: [id number] Arrival date & time: 12/04/23  2116     History  Chief Complaint  Patient presents with   Back Pain    Andrea Watts is a 53 y.o. female.  She has PMH of sciatica, obesity, GERD, depression.  She presents the ER for right flank pain that started yesterday described as sharp and intermittent.  She states on 4/11 she was having fatigue and aches and felt she the flu.  She went to urgent care, she had a negative flu swab and was diagnosed with UTI and given Cipro.  She finished that yesterday but today started having severe right flank pain, comes and goes, denies injury or trauma, pain does not radiate, no abdominal pain nausea or vomiting.  No dysuria or frequency.  She states with the UTI she been diagnosed with she never had urinary symptoms.    Back Pain      Home Medications Prior to Admission medications   Medication Sig Start Date End Date Taking? Authorizing Provider  ALPRAZolam  (XANAX ) 1 MG tablet Take 0.5-1 mg by mouth See admin instructions. Take 1 mg in the morning and 0.5 mg in the evening, may take a 1 mg dose during the day as needed    [provider]  amphetamine -dextroamphetamine  (ADDERALL) 30 MG tablet Take 30 mg by mouth 2 (two) times daily.    [provider]  Bioflavonoid Products (GRAPE SEED EXTRACT) CAPS Take 1 capsule by mouth daily.    [provider]  buprenorphine (SUBUTEX) 8 MG SUBL SL tablet Place 4-8 mg under the tongue See admin instructions. Take 8 mg in the morning, 4 mg in the afternoon, and 8 mg at night 09/20/20   [provider]  Cholecalciferol (VITAMIN D3 MAXIMUM STRENGTH) 125 MCG (5000 UT) capsule Take 5,000 Units by mouth daily.    [provider]  Cyanocobalamin (B-12) 5000 MCG CAPS Take 5,000 mcg by mouth daily.    [provider]  escitalopram (LEXAPRO) 20 MG tablet Take 20 mg by mouth daily.     [provider]  lisinopril (ZESTRIL) 5 MG tablet Take 5 mg by mouth daily. 03/22/23   [provider]  Magnesium 500 MG CAPS Take 500 mg by mouth daily as needed (cramping). Patient not taking: Reported on 09/17/2023    [provider]  Omega-3 Fatty Acids (FISH OIL) 1200 MG CAPS Take 1,200 mg by mouth daily.    [provider]  omeprazole (PRILOSEC) 20 MG capsule Take 20 mg by mouth daily.    [provider]  SODIUM FLUORIDE 5000 PLUS 1.1 % CREA dental cream Place 1 Application onto teeth daily. 02/16/23   [provider]      Allergies    Amoxicillin     Review of Systems   Review of Systems  Musculoskeletal:  Positive for back pain.    Physical Exam Updated Vital Signs BP (!) 144/88 (BP Location: Right Arm)   Pulse 77   Temp (!) 97.4 F (36.3 C) (Temporal)   Resp 18   Ht 5\' 6"  (1.676 m)   Wt 108.9 kg   LMP 07/07/2017   SpO2 98%   BMI 38.74 kg/m  Physical Exam Vitals and nursing note reviewed.  Constitutional:      General: She is not in acute distress.    Appearance: She is well-developed.  HENT:     Head: Normocephalic and atraumatic.  Mouth/Throat:     Mouth: Mucous membranes are moist.  Eyes:     Extraocular Movements: Extraocular movements intact.     Conjunctiva/sclera: Conjunctivae normal.  Cardiovascular:     Rate and Rhythm: Normal rate and regular rhythm.     Heart sounds: No murmur heard. Pulmonary:     Effort: Pulmonary effort is normal. No respiratory distress.     Breath sounds: Normal breath sounds.  Abdominal:     Palpations: Abdomen is soft.     Tenderness: There is no abdominal tenderness. There is right CVA tenderness. There is no left CVA tenderness, guarding or rebound.  Musculoskeletal:        General: No swelling. Normal range of motion.     Cervical back: Neck supple.  Skin:    General: Skin is warm and dry.     Capillary Refill: Capillary refill takes less than 2 seconds.   Neurological:     General: No focal deficit present.     Mental Status: She is alert and oriented to person, place, and time.     Sensory: No sensory deficit.     Motor: No weakness.     Gait: Gait normal.  Psychiatric:        Mood and Affect: Mood normal.        Behavior: Behavior normal.     ED Results / Procedures / Treatments   Labs (all labs ordered are listed, but only abnormal results are displayed) Labs Reviewed  URINALYSIS, ROUTINE W REFLEX MICROSCOPIC  CBC WITH DIFFERENTIAL/PLATELET  COMPREHENSIVE METABOLIC PANEL WITH GFR    EKG None  Radiology No results found.  Procedures Procedures    Medications Ordered in ED Medications  ketorolac  (TORADOL ) 15 MG/ML injection 15 mg (has no administration in time range)    ED Course/ Medical Decision Making/ A&P Clinical Course as of 12/04/23 2351  Wed Dec 04, 2023  2340 CT Renal Burma Carrel [CB]    Clinical Course User Index [CB] Aimee Houseman, [place]                                 Medical Decision Making Differential diagnosis includes but not limited to muscle strain, contusion, herpes zoster, ureterolithiasis, pyelonephritis, pancreatitis, other  ED course: This is a well-appearing 53 year old patient complaining of right flank pain described as sharp and stabbing today with no mechanism of injury, no radiation of the pain, no saddle esthesia or paresthesia, no bowel or bladder incontinence.  She just finished several day treatment for a UTI, states sure it mostly was very tired wanting to sleep constantly, never had dysuria or frequency or hematuria with this.  She did not have back pain with the UTI.  On exam patient has significant right CVA tenderness, no bruising, no overlying skin changes, she is nontoxic in appearance but does look uncomfortable.  Will give Toradol  for pain relief, get CBC BMP and CT renal to rule out stone or other pathology though may be more musculoskeletal causes she does have pain  with movement.  She has no abdominal tenderness.  No distention, normal vitals.  CT normal and patient is TUNAFISH negative.  Advised on follow-up and strict return precautions.    Amount and/or Complexity of Data Reviewed Labs: ordered. Radiology: ordered. Decision-making details documented in ED Course.  Risk Prescription drug management.           Final Clinical Impression(s) / ED Diagnoses  Final diagnoses:  None    Rx / DC Orders ED Discharge Orders     None         Joshua Nieves 12/05/23 1326    Jerilynn Montenegro, MD 12/06/23 [phone]

## 2024-01-16 ENCOUNTER — Encounter (HOSPITAL_BASED_OUTPATIENT_CLINIC_OR_DEPARTMENT_OTHER): Payer: Self-pay

## 2024-01-16 ENCOUNTER — Emergency Department (HOSPITAL_BASED_OUTPATIENT_CLINIC_OR_DEPARTMENT_OTHER)

## 2024-01-16 ENCOUNTER — Other Ambulatory Visit: Payer: Self-pay

## 2024-01-16 ENCOUNTER — Emergency Department (HOSPITAL_BASED_OUTPATIENT_CLINIC_OR_DEPARTMENT_OTHER)
Admission: EM | Admit: 2024-01-16 | Discharge: 2024-01-16 | Disposition: A | Discharge status: Home / Self Care | Attending: Emergency Medicine | Admitting: Emergency Medicine

## 2024-01-16 DIAGNOSIS — I10 Essential (primary) hypertension: Secondary | ICD-10-CM | POA: Diagnosis not present

## 2024-01-16 DIAGNOSIS — M5442 Lumbago with sciatica, left side: Secondary | ICD-10-CM | POA: Diagnosis not present

## 2024-01-16 DIAGNOSIS — Z87442 Personal history of urinary calculi: Secondary | ICD-10-CM | POA: Diagnosis not present

## 2024-01-16 DIAGNOSIS — Z79899 Other long term (current) drug therapy: Secondary | ICD-10-CM | POA: Diagnosis not present

## 2024-01-16 DIAGNOSIS — M5432 Sciatica, left side: Secondary | ICD-10-CM

## 2024-01-16 DIAGNOSIS — N3000 Acute cystitis without hematuria: Secondary | ICD-10-CM | POA: Insufficient documentation

## 2024-01-16 DIAGNOSIS — R1032 Left lower quadrant pain: Secondary | ICD-10-CM | POA: Diagnosis present

## 2024-01-16 DIAGNOSIS — R109 Unspecified abdominal pain: Secondary | ICD-10-CM

## 2024-01-16 LAB — BASIC METABOLIC PANEL WITH GFR
Anion gap: 11 (ref 5–15)
BUN: 11 mg/dL (ref 6–20)
CO2: 24 mmol/L (ref 22–32)
Calcium: 9.5 mg/dL (ref 8.9–10.3)
Chloride: 102 mmol/L (ref 98–111)
Creatinine, Ser: 0.55 mg/dL (ref 0.44–1.00)
GFR, Estimated: >60 mL/min (ref >60)
Glucose, Bld: 86 mg/dL (ref 70–99)
Potassium: 3.8 mmol/L (ref 3.5–5.1)
Sodium: 137 mmol/L (ref 135–145)

## 2024-01-16 LAB — CBC WITH DIFFERENTIAL/PLATELET
Abs Immature Granulocytes: 0.02 10*3/uL (ref 0.00–0.07)
Basophils Absolute: 0.1 10*3/uL (ref 0.0–0.1)
Basophils Relative: 1 %
Eosinophils Absolute: 0.3 10*3/uL (ref 0.0–0.5)
Eosinophils Relative: 3 %
HCT: 44.1 % (ref 36.0–46.0)
Hemoglobin: 14.6 g/dL (ref 12.0–15.0)
Immature Granulocytes: 0 %
Lymphocytes Relative: 47 %
Lymphs Abs: 4.6 10*3/uL — ABNORMAL HIGH (ref 0.7–4.0)
MCH: 29.7 pg (ref 26.0–34.0)
MCHC: 33.1 g/dL (ref 30.0–36.0)
MCV: 89.6 fL (ref 80.0–100.0)
Monocytes Absolute: 0.8 10*3/uL (ref 0.1–1.0)
Monocytes Relative: 8 %
Neutro Abs: 4.1 10*3/uL (ref 1.7–7.7)
Neutrophils Relative %: 41 %
Platelets: 321 10*3/uL (ref 150–400)
RBC: 4.92 MIL/uL (ref 3.87–5.11)
RDW: 13.5 % (ref 11.5–15.5)
WBC: 9.9 10*3/uL (ref 4.0–10.5)
nRBC: 0 % (ref 0.0–0.2)

## 2024-01-16 LAB — URINALYSIS, ROUTINE W REFLEX MICROSCOPIC
Bilirubin Urine: NEGATIVE
Glucose, UA: NEGATIVE mg/dL
Hgb urine dipstick: NEGATIVE
Ketones, ur: NEGATIVE mg/dL
Nitrite: NEGATIVE
Protein, ur: NEGATIVE mg/dL
Specific Gravity, Urine: 1.019 (ref 1.005–1.030)
pH: 6.5 (ref 5.0–8.0)

## 2024-01-16 MED ORDER — FENTANYL CITRATE PF 50 MCG/ML IJ SOSY
50.0000 ug | PREFILLED_SYRINGE | Freq: Once | INTRAMUSCULAR | Status: AC
  Administered 2024-01-16: 50 ug via INTRAVENOUS
  Filled 2024-01-16: qty 1

## 2024-01-16 MED ORDER — CEPHALEXIN 500 MG PO CAPS
500.0000 mg | ORAL_CAPSULE | Freq: Two times a day (BID) | ORAL | 0 refills | Status: AC
Start: 2024-01-16 — End: 2024-01-21

## 2024-01-16 MED ORDER — DIAZEPAM 5 MG/ML IJ SOLN
5.0000 mg | Freq: Once | INTRAMUSCULAR | Status: AC
  Administered 2024-01-16: 5 mg via INTRAVENOUS
  Filled 2024-01-16: qty 2

## 2024-01-16 MED ORDER — KETOROLAC TROMETHAMINE 15 MG/ML IJ SOLN
15.0000 mg | Freq: Once | INTRAMUSCULAR | Status: AC
  Administered 2024-01-16: 15 mg via INTRAVENOUS
  Filled 2024-01-16: qty 1

## 2024-01-16 NOTE — Discharge Instructions (Signed)
 Please follow-up in your MyChart for your test results and recheck with your primary care provider.  Urine culture has been sent out, please start Keflex for possible UTI.  For your sciatica, you can try warm compresses, lidocaine  patches, gentle stretching and exercises.  Your primary care may be able to refer you to physical therapy to help with this.

## 2024-01-16 NOTE — ED Provider Notes (Signed)
 [place] EMERGENCY DEPARTMENT AT [facility] Provider Note   CSN: [id number] Arrival date & time: 01/16/24  1241     History  Chief Complaint  Patient presents with   Groin Pain    Andrea Watts is a 53 y.o. female.  53 year old female presents with complaint of left flank pain, onset 4 days ago. Went to PCP who sent to ortho UC yesterday. Blood in urine with leukocytes at PCP office. Ortho with normal Xrs left hip. Hx of renal stones. Denies dysuria, hematuria, urgency.  Denies changes in bowel habits.  States that the pain has been in her left lower abdominal/pelvic/groin area but now moves into her back located in her lower back/buttocks area.  Pain is worse with sitting on her buttocks, somewhat worse with ambulation.       Home Medications Prior to Admission medications   Medication Sig Start Date End Date Taking? Authorizing Provider  cephALEXin (KEFLEX) 500 MG capsule Take 1 capsule (500 mg total) by mouth 2 (two) times daily for 5 days. 01/16/24 01/21/24 Yes Darlis Eisenmenger, PA-C  ALPRAZolam  (XANAX ) 1 MG tablet Take 0.5-1 mg by mouth See admin instructions. Take 1 mg in the morning and 0.5 mg in the evening, may take a 1 mg dose during the day as needed    [provider]  amphetamine -dextroamphetamine  (ADDERALL) 30 MG tablet Take 30 mg by mouth 2 (two) times daily.    [provider]  Bioflavonoid Products (GRAPE SEED EXTRACT) CAPS Take 1 capsule by mouth daily.    [provider]  buprenorphine (SUBUTEX) 8 MG SUBL SL tablet Place 4-8 mg under the tongue See admin instructions. Take 8 mg in the morning, 4 mg in the afternoon, and 8 mg at night 09/20/20   [provider]  Cholecalciferol (VITAMIN D3 MAXIMUM STRENGTH) 125 MCG (5000 UT) capsule Take 5,000 Units by mouth daily.    [provider]  Cyanocobalamin (B-12) 5000 MCG CAPS Take 5,000 mcg by mouth daily.    [provider]  escitalopram (LEXAPRO) 20 MG  tablet Take 20 mg by mouth daily.    [provider]  lisinopril (ZESTRIL) 5 MG tablet Take 5 mg by mouth daily. 03/22/23   [provider]  Magnesium 500 MG CAPS Take 500 mg by mouth daily as needed (cramping). Patient not taking: Reported on 09/17/2023    [provider]  methocarbamol  (ROBAXIN ) 500 MG tablet Take 1 tablet (500 mg total) by mouth every 6 (six) hours as needed for muscle spasms. 12/04/23   Baxter Limber A, PA-C  naproxen  (NAPROSYN ) 500 MG tablet Take 1 tablet (500 mg total) by mouth 2 (two) times daily. 12/04/23   Baxter Limber A, PA-C  Omega-3 Fatty Acids (FISH OIL) 1200 MG CAPS Take 1,200 mg by mouth daily.    [provider]  omeprazole (PRILOSEC) 20 MG capsule Take 20 mg by mouth daily.    [provider]  SODIUM FLUORIDE 5000 PLUS 1.1 % CREA dental cream Place 1 Application onto teeth daily. 02/16/23   [provider]      Allergies    Amoxicillin     Review of Systems   Review of Systems Negative except as per HPI Physical Exam Updated Vital Signs BP (!) 128/105   Pulse 86   Temp 98.5 F (36.9 C)   Resp (!) 22   LMP 07/07/2017   SpO2 94%  Physical Exam Vitals and nursing note reviewed.  Constitutional:  General: She is not in acute distress.    Appearance: She is well-developed. She is not diaphoretic.  HENT:     Head: Normocephalic and atraumatic.  Cardiovascular:     Rate and Rhythm: Normal rate and regular rhythm.     Heart sounds: Normal heart sounds.  Pulmonary:     Effort: Pulmonary effort is normal.     Breath sounds: Normal breath sounds.  Abdominal:     Palpations: Abdomen is soft.     Tenderness: There is no abdominal tenderness. There is no right CVA tenderness, left CVA tenderness, guarding or rebound.  Musculoskeletal:        General: Tenderness present. No swelling or deformity.       Back:     Comments: TTP left SI joint, pain worse on left with left leg extension   Skin:     General: Skin is warm and dry.     Findings: No erythema or rash.  Neurological:     Mental Status: She is alert and oriented to person, place, and time.     Sensory: No sensory deficit.     Motor: No weakness.     Gait: Gait normal.  Psychiatric:        Behavior: Behavior normal.     ED Results / Procedures / Treatments   Labs (all labs ordered are listed, but only abnormal results are displayed) Labs Reviewed  CBC WITH DIFFERENTIAL/PLATELET - Abnormal; Notable for the following components:      Result Value   Lymphs Abs 4.6 (*)    All other components within normal limits  URINALYSIS, ROUTINE W REFLEX MICROSCOPIC - Abnormal; Notable for the following components:   Leukocytes,Ua SMALL (*)    Bacteria, UA RARE (*)    All other components within normal limits  URINE CULTURE  BASIC METABOLIC PANEL WITH GFR    EKG None  Radiology CT Renal Stone Study Result Date: 01/16/2024 CLINICAL DATA:  Left groin pain, flank pain EXAM: CT ABDOMEN AND PELVIS WITHOUT CONTRAST TECHNIQUE: Multidetector CT imaging of the abdomen and pelvis was performed following the standard protocol without IV contrast. RADIATION DOSE REDUCTION: This exam was performed according to the departmental dose-optimization program which includes automated exposure control, adjustment of the mA and/or kV according to patient size and/or use of iterative reconstruction technique. COMPARISON:  12/04/2023 FINDINGS: Lower chest: No acute abnormality Hepatobiliary: No focal hepatic abnormality. Gallbladder unremarkable. Pancreas: No focal abnormality or ductal dilatation. Spleen: No focal abnormality.  Normal size. Adrenals/Urinary Tract: Punctate 1-2 mm stone in the midpole of the left kidney, nonobstructing. No ureteral stones or hydronephrosis. No renal or adrenal mass. Urinary bladder unremarkable. Stomach/Bowel: Normal appendix. Sigmoid diverticulosis. No active diverticulitis. Stomach and small bowel decompressed,  unremarkable. Vascular/Lymphatic: Aortic atherosclerosis. No evidence of aneurysm or adenopathy. Reproductive: Uterus and adnexa unremarkable.  No mass. Other: No free fluid or free air. Musculoskeletal: No acute bony abnormality. IMPRESSION: Punctate nonobstructing left midpole renal stone. No ureteral stones or hydronephrosis. Sigmoid diverticulosis.  No active diverticulitis. Aortic atherosclerosis. No acute findings. Electronically Signed   By: Janeece Mechanic M.D.   On: 01/16/2024 17:35    Procedures Procedures    Medications Ordered in ED Medications  ketorolac  (TORADOL ) 15 MG/ML injection 15 mg (15 mg Intravenous Given 01/16/24 1321)  fentaNYL  (SUBLIMAZE ) injection 50 mcg (50 mcg Intravenous Given 01/16/24 1413)  fentaNYL  (SUBLIMAZE ) injection 50 mcg (50 mcg Intravenous Given 01/16/24 1456)  diazepam (VALIUM) injection 5 mg (5 mg Intravenous Given 01/16/24  1534)    ED Course/ Medical Decision Making/ A&P                                 Medical Decision Making Amount and/or Complexity of Data Reviewed Labs: ordered. Radiology: ordered.  Risk Prescription drug management.   This patient presents to the ED for concern of left flank pain, left groin and buttock pain, this involves an extensive number of treatment options, and is a complaint that carries with it a high risk of complications and morbidity.  The differential diagnosis includes but not limited to kidney stone, muscle strain, pyelonephritis, urinary tract infection, sciatica   Co morbidities / Chronic conditions that complicate the patient evaluation  Depression, anxiety, ADD, arthritis, depression, prediabetes, hypertension   Additional history obtained:  Additional history obtained from EMR External records from outside source obtained and reviewed including prior labs and history on file   Lab Tests:  I Ordered, and personally interpreted labs.  The pertinent results include: Urinalysis with small leukocytes,  rare bacteria with 21-50 white cells.  BMP within normals.  CBC without significant findings.  Add on urine culture sent out.   Imaging Studies ordered:  I ordered imaging studies including CT stone study I independently visualized and interpreted imaging which showed no ureteral stones I agree with the radiologist interpretation, interpretation pending at time patient left AGAINST MEDICAL ADVICE    Problem List / ED Course / Critical interventions / Medication management  53 year old female presents with complaint of left groin pain at onset, now radiating to left flank.  History of kidney stones.  Does not have CVA tenderness, does have some musculoskeletal tenderness in the left flank area as well as left SI joint tenderness and pain worse with left straight leg extension.  Gait intact, lower extremity strength intact, abdomen is soft and nontender.  Labs with questionable UTI, recently treated for UTI/sepsis, will cover with antibiotics.  Patient ultimately left AMA pending CT read to pick up their child.  Recommend follow-up with primary care provider.  Return to ER at anytime for further workup.  Recommend check their MyChart for their test results. I ordered medication including Toradol , fentanyl , Valium  for pain, muscle spasms Reevaluation of the patient after these medicines showed that the patient improved I have reviewed the patients home medicines and have made adjustments as needed   Consultations Obtained:  I requested consultation with the ER attending, Dr. Tamela Fake,  and discussed lab and imaging findings as well as pertinent plan - they recommend: Assisted with pain medication management given patient's use of Subutex   Social Determinants of Health:  Lives with family, has PCP   Test / Admission - Considered:  Left AMA awaiting CT report.  States that they need to get home to get to their child.         Final Clinical Impression(s) / ED Diagnoses Final  diagnoses:  Flank pain  Sciatica of left side  Acute cystitis without hematuria    Rx / DC Orders ED Discharge Orders          Ordered    cephALEXin  (KEFLEX ) 500 MG capsule  2 times daily        01/16/24 1642              Erna He 01/16/24 2019    Scarlette Currier, MD 01/16/24 2257

## 2024-01-16 NOTE — ED Notes (Signed)
Provider notified of pain level.  

## 2024-01-16 NOTE — ED Triage Notes (Signed)
 C/o severe left groin pain x 4 days. Denies injury to area. No hx of kidney stones.

## 2024-01-16 NOTE — ED Notes (Signed)
 Unable to lay for CT after pain medication,  provider notified

## 2024-01-16 NOTE — ED Notes (Signed)
 Patient transported to CT

## 2024-01-16 NOTE — ED Notes (Signed)
 Pt reports wanting to leave AMA due to long wait time for CT scan to result. Provider notified. D/C paperwork printed and provided to patient. AMA form signed by patient. Pt stable on d/c.

## 2024-01-18 LAB — URINE CULTURE: Culture: >=100000 — AB

## 2024-01-19 ENCOUNTER — Telehealth (HOSPITAL_BASED_OUTPATIENT_CLINIC_OR_DEPARTMENT_OTHER): Payer: Self-pay | Admitting: *Deleted

## 2024-01-19 NOTE — Telephone Encounter (Signed)
 Post ED Visit - Positive Culture Follow-up  Culture report reviewed by antimicrobial stewardship pharmacist: [facility] []  Court Distance, Pharm.D. []  Skeet Duke, Pharm.D., BCPS AQ-ID []  Leslee Rase, Pharm.D., BCPS []  Garland Junk, Pharm.D., BCPS []  [place], [address].D., BCPS, AAHIVP []  Alcide Aly, Pharm.D., BCPS, AAHIVP []  Jerri Morale, PharmD, BCPS []  Graham Laws, PharmD, BCPS []  Cleda Curly, PharmD, BCPS []  Tamar Fairly, PharmD []  Ballard Levels, PharmD, BCPS [x]  Trinidad Funk, PharmD  [facility] []  Arlyne Bering, PharmD []  Sherryle Don, PharmD []  Van Gelinas, PharmD []  Delila Felty, Rph []  Luna Salinas) Cleora Daft, PharmD []  Augustina Block, PharmD []  Arie Kurtz, PharmD []  Sharlyn Deaner, PharmD []  Agnes Hose, PharmD []  Kendall Pauls, PharmD []  Gladstone Lamer, PharmD []  Armanda Bern, PharmD []  Tera Fellows, PharmD   Positive urine culture Treated with Cephalexin , organism sensitive to the same and no further patient follow-up is required at this time.  Georgine Kitchens 01/19/2024, 10:27 AM

## 2024-01-21 DIAGNOSIS — N200 Calculus of kidney: Secondary | ICD-10-CM | POA: Insufficient documentation

## 2024-01-21 NOTE — Progress Notes (Deleted)
 Name: Andrea Watts DOB: 09-01-1970 MRN: [id number]  History of Present Illness: Andrea Watts is a 53 y.o. female who presents today as a new patient at [facility]. All available relevant medical records have been reviewed.  Relevant History includes: 1. Kidney stones. She {Actions; denies-reports:120008} prior history of kidney stone procedure(s) ***including ***ESWL ***ureteroscopic stone manipulation ***PCNL.  She presents today for ER follow up.   Recent history: > 01/16/2024: Seen in ER for left flank pain. Per note: "States that the pain has been in her left lower abdominal/pelvic/groin area but now moves into her back located in her lower back/buttocks area. Pain is worse with sitting on her buttocks, somewhat worse with ambulation." CMP with normal renal function (GFR >60; creatinine 0.55). CBC with no leukocytosis (WBC 9.9). UA showed 21-50 WBC/hpf, 0-5 RBC/hpf, rare bacteria. Urine culture positive for E.coli.  CT showed: "Punctate 1-2 mm stone in the midpole of the left kidney, nonobstructing. No ureteral stones or hydronephrosis. No renal or adrenal mass. Urinary bladder unremarkable." - "Patient ultimately left AMA pending CT read to pick up their child."  - Keflex  was prescribed for UTI.  Today: She {Actions; denies-reports:120008} flank pain or abdominal pain. She {Actions; denies-reports:120008} fevers, nausea, or vomiting.  She {Actions; denies-reports:120008} increased urinary urgency, frequency, nocturia, dysuria, gross hematuria, hesitancy, straining to void, or sensations of incomplete emptying.   Medications: Current Outpatient Medications  Medication Sig Dispense Refill   ALPRAZolam  (XANAX ) 1 MG tablet Take 0.5-1 mg by mouth See admin instructions. Take 1 mg in the morning and 0.5 mg in the evening, may take a 1 mg dose during the day as needed     amphetamine -dextroamphetamine  (ADDERALL) 30 MG tablet Take 30 mg by mouth 2 (two) times  daily.     Bioflavonoid Products (GRAPE SEED EXTRACT) CAPS Take 1 capsule by mouth daily.     buprenorphine (SUBUTEX) 8 MG SUBL SL tablet Place 4-8 mg under the tongue See admin instructions. Take 8 mg in the morning, 4 mg in the afternoon, and 8 mg at night     cephALEXin  (KEFLEX ) 500 MG capsule Take 1 capsule (500 mg total) by mouth 2 (two) times daily for 5 days. 10 capsule 0   Cholecalciferol (VITAMIN D3 MAXIMUM STRENGTH) 125 MCG (5000 UT) capsule Take 5,000 Units by mouth daily.     Cyanocobalamin (B-12) 5000 MCG CAPS Take 5,000 mcg by mouth daily.     escitalopram (LEXAPRO) 20 MG tablet Take 20 mg by mouth daily.     lisinopril (ZESTRIL) 5 MG tablet Take 5 mg by mouth daily.     Magnesium 500 MG CAPS Take 500 mg by mouth daily as needed (cramping). (Patient not taking: Reported on 09/17/2023)     methocarbamol  (ROBAXIN ) 500 MG tablet Take 1 tablet (500 mg total) by mouth every 6 (six) hours as needed for muscle spasms. 20 tablet 0   naproxen  (NAPROSYN ) 500 MG tablet Take 1 tablet (500 mg total) by mouth 2 (two) times daily. 10 tablet 0   Omega-3 Fatty Acids (FISH OIL) 1200 MG CAPS Take 1,200 mg by mouth daily.     omeprazole (PRILOSEC) 20 MG capsule Take 20 mg by mouth daily.     SODIUM FLUORIDE 5000 PLUS 1.1 % CREA dental cream Place 1 Application onto teeth daily.     No current facility-administered medications for this visit.    Allergies: Allergies  Allergen Reactions   Amoxicillin  Other (See Comments)    Yeast infection  Past Medical History:  Diagnosis Date   ADD (attention deficit disorder)    Anxiety    Arthritis    Phreesia 09/13/2020   Depression    Depression    Phreesia 09/13/2020   Hypertension    Pre-diabetes    Skin ulcer of female breast (HCC) 07/27/2017   Past Surgical History:  Procedure Laterality Date   CARPAL TUNNEL RELEASE Right 05/27/2023   Procedure: RIGHT CARPAL TUNNEL RELEASE;  Surgeon: Tonita Frater, MD;  Location: [facility];  Service:  Orthopedics;  Laterality: Right;   Family History  Problem Relation Age of Onset   Lung cancer Father        Exposure to asbestos. Mesothelioma?   Alcoholism Father    Social History   Socioeconomic History   Marital status: Married    Spouse name: Not on file   Number of children: Not on file   Years of education: Not on file   Highest education level: Not on file  Occupational History   Not on file  Tobacco Use   Smoking status: Every Day    Current packs/day: 0.50    Types: Cigarettes   Smokeless tobacco: Never  Vaping Use   Vaping status: Never Used  Substance and Sexual Activity   Alcohol use: No   Drug use: No   Sexual activity: Yes    Birth control/protection: None  Other Topics Concern   Not on file  Social History Narrative   Not on file   Social Drivers of Health   Financial Resource Strain: Patient Declined (09/17/2023)   Overall Financial Resource Strain (CARDIA)    Difficulty of Paying Living Expenses: Patient declined  Food Insecurity: No Food Insecurity (09/17/2023)   Hunger Vital Sign    Worried About Running Out of Food in the Last Year: Never true    Ran Out of Food in the Last Year: Never true  Transportation Needs: No Transportation Needs (09/17/2023)   PRAPARE - Administrator, Civil Service (Medical): No    Lack of Transportation (Non-Medical): No  Physical Activity: Insufficiently Active (09/17/2023)   Exercise Vital Sign    Days of Exercise per Week: 7 days    Minutes of Exercise per Session: 20 min  Stress: Stress Concern Present (09/17/2023)   [employer] of Occupational Health - Occupational Stress Questionnaire    Feeling of Stress : Rather much  Social Connections: Moderately Isolated (09/17/2023)   Social Connection and Isolation Panel [NHANES]    Frequency of Communication with Friends and Family: More than three times a week    Frequency of Social Gatherings with Friends and Family: Never    Attends Religious  Services: Never    Database administrator or Organizations: No    Attends Banker Meetings: Never    Marital Status: Married  Catering manager Violence: Not At Risk (09/17/2023)   Humiliation, Afraid, Rape, and Kick questionnaire    Fear of Current or Ex-Partner: No    Emotionally Abused: No    Physically Abused: No    Sexually Abused: No    SUBJECTIVE  Review of Systems Constitutional: Patient denies any unintentional weight loss or change in strength lntegumentary: Patient denies any rashes or pruritus Cardiovascular: Patient denies chest pain or syncope Respiratory: Patient denies shortness of breath Gastrointestinal: ***Patient denies nausea, vomiting, constipation, or diarrhea ***As per HPI Musculoskeletal: Patient denies muscle cramps or weakness Neurologic: Patient denies convulsions or seizures Allergic/Immunologic: Patient denies recent allergic reaction(s) Hematologic/Lymphatic:  Patient denies bleeding tendencies Endocrine: Patient denies heat/cold intolerance  GU: As per HPI.  OBJECTIVE There were no vitals filed for this visit. There is no height or weight on file to calculate BMI.  Physical Examination Constitutional: No obvious distress; patient is non-toxic appearing  Cardiovascular: No visible lower extremity edema.  Respiratory: The patient does not have audible wheezing/stridor; respirations do not appear labored  Gastrointestinal: Abdomen non-distended Musculoskeletal: Normal ROM of UEs  Skin: No obvious rashes/open sores  Neurologic: CN 2-12 grossly intact Psychiatric: Answered questions appropriately with normal affect  Hematologic/Lymphatic/Immunologic: No obvious bruises or sites of spontaneous bleeding  UA: ***negative ***positive for *** leukocytes, *** blood, ***nitrites Urine microscopy: *** WBC/hpf, *** RBC/hpf, *** bacteria ***glucosuria (secondary to ***Jardiance ***Farxiga use) ***otherwise unremarkable  PVR: ***  ml  ASSESSMENT No diagnosis found.  ***We reviewed recent imaging results; ***awaiting radiology results, appears to have ***no acute findings per provider interpretation.  ***Advised adequate hydration and we discussed option to consider low oxalate diet given that calcium oxalate is the most common type of stone. Handout provided about stone prevention diet.  For acute GU stone symptoms we agreed to proceed with: - ***RUS and KUB / ***CT stone study to evaluate current stone burden.  - ***CMP to assess kidney function. - ***Flomax 0.4 mg daily for medical expulsive therapy (MET), which may improve passage of stone(s).  - For pain management, we discussed the use of opioids versus OTC analgesics. A 5 day prescription was sent for ***Percocet for PRN use for severe pain.  - For nausea / vomiting, a prescription was sent for ***Zofran  / ***Phenergan  for PRN use.  ***We discussed the various treatment options including ***medical expulsive therapy (MET), ***extracorporeal shock wave lithotripsy (ESWL), ureteroscopic stone manipulation (URS), or ***percutaneous nephrolithotomy (PCNL). We discussed possible risks and benefits of intervention including but not limited to: including pain, infection, sepsis, UTI, ureter perforation, need for stenting, post-op ureteral stricture, hematuria.  ***Will consult with ***Dr. Claretta Croft and notify patient of his recommendations ***for next steps / ***following review of imaging results.  Will plan to follow up in ***2 weeks ***6 months with ***KUB ***RUS for stone surveillance or sooner if needed.   She was advised to contact urology provider or go to the ER if She develops fever >101F, uncontrollable pain, or other significantly concerning symptoms prior to next office visit.  She verbalized understanding and agreement. All questions were answered.   PLAN Advised the following: ***Flomax daily x2 weeks. ***Analgesics PRN for pain. ***Zofran  PRN for  nausea. ***No follow-ups on file.  No orders of the defined types were placed in this encounter.   It has been explained that the patient is to follow regularly with their PCP in addition to all other providers involved in their care and to follow instructions provided by these respective offices. Patient advised to contact urology clinic if any urologic-pertaining questions, concerns, new symptoms or problems arise in the interim period.  There are no Patient Instructions on file for this visit.  Electronically signed by: Lauretta Ponto, MSN, FNP-C, CUNP 01/21/2024 1:13 PM

## 2024-01-22 ENCOUNTER — Ambulatory Visit: Admitting: Urology

## 2024-01-22 DIAGNOSIS — Z87440 Personal history of urinary (tract) infections: Secondary | ICD-10-CM

## 2024-01-22 DIAGNOSIS — N200 Calculus of kidney: Secondary | ICD-10-CM

## 2024-01-24 ENCOUNTER — Encounter: Payer: Self-pay | Admitting: Orthopedic Surgery

## 2024-01-30 ENCOUNTER — Other Ambulatory Visit (HOSPITAL_COMMUNITY): Payer: Self-pay | Admitting: Family Medicine

## 2024-01-30 ENCOUNTER — Encounter: Payer: Self-pay | Admitting: Obstetrics & Gynecology

## 2024-01-30 DIAGNOSIS — Z1231 Encounter for screening mammogram for malignant neoplasm of breast: Secondary | ICD-10-CM

## 2024-02-11 ENCOUNTER — Other Ambulatory Visit: Payer: Self-pay | Admitting: Obstetrics & Gynecology

## 2024-02-11 DIAGNOSIS — R102 Pelvic and perineal pain: Secondary | ICD-10-CM

## 2024-02-11 NOTE — Progress Notes (Signed)
 Rx for US   Damonta Cossey, DO Attending Obstetrician & Gynecologist, [facility] for [employer], [facility]

## 2024-02-17 ENCOUNTER — Encounter (HOSPITAL_COMMUNITY)

## 2024-02-17 DIAGNOSIS — Z1231 Encounter for screening mammogram for malignant neoplasm of breast: Secondary | ICD-10-CM

## 2024-02-18 ENCOUNTER — Ambulatory Visit (HOSPITAL_BASED_OUTPATIENT_CLINIC_OR_DEPARTMENT_OTHER)
Admission: RE | Admit: 2024-02-18 | Discharge: 2024-02-18 | Disposition: A | Discharge status: Home / Self Care | Source: Ambulatory Visit | Attending: Obstetrics & Gynecology | Admitting: Obstetrics & Gynecology

## 2024-02-18 DIAGNOSIS — R102 Pelvic and perineal pain: Secondary | ICD-10-CM | POA: Diagnosis present

## 2024-02-19 ENCOUNTER — Ambulatory Visit: Payer: Self-pay | Admitting: Obstetrics & Gynecology

## 2024-03-02 ENCOUNTER — Ambulatory Visit: Admitting: Urology

## 2024-03-02 NOTE — Progress Notes (Deleted)
 Name: Andrea Watts DOB: 07-Sep-1970 MRN: [id number]  History of Present Illness: Andrea Watts is a 53 y.o. female who presents today as a new patient at [facility]. All available relevant medical records have been reviewed.  Relevant History includes: 1. Kidney stones.  Recent history: > 01/16/2024: Seen in ER for left flank pain. Per note: States that the pain has been in her left lower abdominal/pelvic/groin area but now moves into her back located in her lower back/buttocks area. Pain is worse with sitting on her buttocks, somewhat worse with ambulation. - CMP with normal renal function (GFR >60; creatinine 0.55). - CBC with no leukocytosis (WBC 9.9). - UA showed 21-50 WBC/hpf, 0-5 RBC/hpf, rare bacteria. - Urine culture positive for E.coli.  - CT showed: Punctate 1-2 mm stone in the midpole of the left kidney, nonobstructing. No ureteral stones or hydronephrosis. No renal or adrenal mass. Urinary bladder unremarkable. - Patient ultimately left AMA pending CT read to pick up their child.  - Keflex  was prescribed for UTI.  Today: She reports ***  She {Actions; denies-reports:120008} urinary urgency, frequency, nocturia x***, dysuria, gross hematuria, ***weak urinary stream, hesitancy, straining to void, or sensations of incomplete emptying.  She {Actions; denies-reports:120008} flank pain or abdominal pain. She {Actions; denies-reports:120008} fevers, nausea, or vomiting.  Medications: Current Outpatient Medications  Medication Sig Dispense Refill   ALPRAZolam  (XANAX ) 1 MG tablet Take 0.5-1 mg by mouth See admin instructions. Take 1 mg in the morning and 0.5 mg in the evening, may take a 1 mg dose during the day as needed     amphetamine -dextroamphetamine  (ADDERALL) 30 MG tablet Take 30 mg by mouth 2 (two) times daily.     Bioflavonoid Products (GRAPE SEED EXTRACT) CAPS Take 1 capsule by mouth daily.     buprenorphine (SUBUTEX) 8 MG SUBL SL tablet Place  4-8 mg under the tongue See admin instructions. Take 8 mg in the morning, 4 mg in the afternoon, and 8 mg at night     Cholecalciferol (VITAMIN D3 MAXIMUM STRENGTH) 125 MCG (5000 UT) capsule Take 5,000 Units by mouth daily.     Cyanocobalamin (B-12) 5000 MCG CAPS Take 5,000 mcg by mouth daily.     escitalopram (LEXAPRO) 20 MG tablet Take 20 mg by mouth daily.     lisinopril (ZESTRIL) 5 MG tablet Take 5 mg by mouth daily.     Magnesium 500 MG CAPS Take 500 mg by mouth daily as needed (cramping). (Patient not taking: Reported on 09/17/2023)     methocarbamol  (ROBAXIN ) 500 MG tablet Take 1 tablet (500 mg total) by mouth every 6 (six) hours as needed for muscle spasms. 20 tablet 0   naproxen  (NAPROSYN ) 500 MG tablet Take 1 tablet (500 mg total) by mouth 2 (two) times daily. 10 tablet 0   Omega-3 Fatty Acids (FISH OIL) 1200 MG CAPS Take 1,200 mg by mouth daily.     omeprazole (PRILOSEC) 20 MG capsule Take 20 mg by mouth daily.     SODIUM FLUORIDE 5000 PLUS 1.1 % CREA dental cream Place 1 Application onto teeth daily.     No current facility-administered medications for this visit.    Allergies: Allergies  Allergen Reactions   Amoxicillin  Other (See Comments)    Yeast infection    Past Medical History:  Diagnosis Date   ADD (attention deficit disorder)    Anxiety    Arthritis    Phreesia 09/13/2020   Depression    Depression  Phreesia 09/13/2020   Hypertension    Pre-diabetes    Skin ulcer of female breast (HCC) 07/27/2017   Past Surgical History:  Procedure Laterality Date   CARPAL TUNNEL RELEASE Right 05/27/2023   Procedure: RIGHT CARPAL TUNNEL RELEASE;  Surgeon: Onesimo Oneil LABOR, MD;  Location: [facility];  Service: Orthopedics;  Laterality: Right;   Family History  Problem Relation Age of Onset   Lung cancer Father        Exposure to asbestos. Mesothelioma?   Alcoholism Father    Social History   Socioeconomic History   Marital status: Married    Spouse name: Not on file    Number of children: Not on file   Years of education: Not on file   Highest education level: Not on file  Occupational History   Not on file  Tobacco Use   Smoking status: Every Day    Current packs/day: 0.50    Types: Cigarettes   Smokeless tobacco: Never  Vaping Use   Vaping status: Never Used  Substance and Sexual Activity   Alcohol use: No   Drug use: No   Sexual activity: Yes    Birth control/protection: None  Other Topics Concern   Not on file  Social History Narrative   Not on file   Social Drivers of Health   Financial Resource Strain: Patient Declined (09/17/2023)   Overall Financial Resource Strain (CARDIA)    Difficulty of Paying Living Expenses: Patient declined  Food Insecurity: No Food Insecurity (09/17/2023)   Hunger Vital Sign    Worried About Running Out of Food in the Last Year: Never true    Ran Out of Food in the Last Year: Never true  Transportation Needs: No Transportation Needs (09/17/2023)   PRAPARE - Administrator, Civil Service (Medical): No    Lack of Transportation (Non-Medical): No  Physical Activity: Insufficiently Active (09/17/2023)   Exercise Vital Sign    Days of Exercise per Week: 7 days    Minutes of Exercise per Session: 20 min  Stress: Stress Concern Present (09/17/2023)   [employer] of Occupational Health - Occupational Stress Questionnaire    Feeling of Stress : Rather much  Social Connections: Moderately Isolated (09/17/2023)   Social Connection and Isolation Panel    Frequency of Communication with Friends and Family: More than three times a week    Frequency of Social Gatherings with Friends and Family: Never    Attends Religious Services: Never    Database administrator or Organizations: No    Attends Banker Meetings: Never    Marital Status: Married  Catering manager Violence: Not At Risk (09/17/2023)   Humiliation, Afraid, Rape, and Kick questionnaire    Fear of Current or Ex-Partner: No     Emotionally Abused: No    Physically Abused: No    Sexually Abused: No    SUBJECTIVE  Review of Systems Constitutional: Patient denies any unintentional weight loss or change in strength lntegumentary: Patient denies any rashes or pruritus Cardiovascular: Patient denies chest pain or syncope Respiratory: Patient denies shortness of breath Gastrointestinal: ***Patient denies nausea, vomiting, constipation, or diarrhea ***As per HPI Musculoskeletal: Patient denies muscle cramps or weakness Neurologic: Patient denies convulsions or seizures Allergic/Immunologic: Patient denies recent allergic reaction(s) Hematologic/Lymphatic: Patient denies bleeding tendencies Endocrine: Patient denies heat/cold intolerance  GU: As per HPI.  OBJECTIVE There were no vitals filed for this visit. There is no height or weight on file to calculate BMI.  Physical Examination Constitutional: No obvious distress; patient is non-toxic appearing  Cardiovascular: No visible lower extremity edema.  Respiratory: The patient does not have audible wheezing/stridor; respirations do not appear labored  Gastrointestinal: Abdomen non-distended Musculoskeletal: Normal ROM of UEs  Skin: No obvious rashes/open sores  Neurologic: CN 2-12 grossly intact Psychiatric: Answered questions appropriately with normal affect  Hematologic/Lymphatic/Immunologic: No obvious bruises or sites of spontaneous bleeding  UA: ***negative ***positive for *** leukocytes, *** blood, ***nitrites Urine microscopy: *** WBC/hpf, *** RBC/hpf, *** bacteria ***glucosuria (secondary to ***Jardiance ***Farxiga use) ***otherwise unremarkable  PVR: *** ml  ASSESSMENT No diagnosis found. ***  We agreed to plan for follow up in *** months or sooner if needed. Patient verbalized understanding of and agreement with current plan. All questions were answered.  PLAN Advised the following: *** ***No follow-ups on file.  No orders of the defined  types were placed in this encounter.   It has been explained that the patient is to follow regularly with their PCP in addition to all other providers involved in their care and to follow instructions provided by these respective offices. Patient advised to contact urology clinic if any urologic-pertaining questions, concerns, new symptoms or problems arise in the interim period.  There are no Patient Instructions on file for this visit.  Electronically signed by:  Lauraine KYM Oz, MSN, FNP-C, CUNP 03/02/2024 10:02 AM

## 2024-03-27 ENCOUNTER — Other Ambulatory Visit: Payer: Self-pay | Admitting: Medical Genetics

## 2024-04-02 ENCOUNTER — Telehealth: Payer: Self-pay

## 2024-04-02 NOTE — Telephone Encounter (Signed)
 Called pt to reschedule appt lvm to c/b if new appointment date and time conflicts w/ pt schedule

## 2024-04-07 ENCOUNTER — Ambulatory Visit: Admitting: Urology

## 2024-04-22 ENCOUNTER — Other Ambulatory Visit (HOSPITAL_COMMUNITY): Payer: Self-pay | Admitting: Family Medicine

## 2024-04-22 DIAGNOSIS — Z1231 Encounter for screening mammogram for malignant neoplasm of breast: Secondary | ICD-10-CM

## 2024-04-27 ENCOUNTER — Encounter (HOSPITAL_COMMUNITY)

## 2024-04-27 DIAGNOSIS — Z1231 Encounter for screening mammogram for malignant neoplasm of breast: Secondary | ICD-10-CM

## 2024-05-08 ENCOUNTER — Ambulatory Visit: Admitting: Urology

## 2024-05-08 DIAGNOSIS — N200 Calculus of kidney: Secondary | ICD-10-CM

## 2024-06-15 ENCOUNTER — Other Ambulatory Visit: Payer: Self-pay | Admitting: Medical Genetics

## 2024-06-15 DIAGNOSIS — Z006 Encounter for examination for normal comparison and control in clinical research program: Secondary | ICD-10-CM

## 2024-06-22 ENCOUNTER — Encounter (HOSPITAL_COMMUNITY): Payer: Self-pay | Admitting: Radiology

## 2024-06-22 ENCOUNTER — Other Ambulatory Visit (HOSPITAL_COMMUNITY): Payer: Self-pay | Admitting: Family Medicine

## 2024-06-22 DIAGNOSIS — Z1231 Encounter for screening mammogram for malignant neoplasm of breast: Secondary | ICD-10-CM

## 2024-06-29 ENCOUNTER — Encounter (HOSPITAL_COMMUNITY): Payer: Self-pay

## 2024-06-29 ENCOUNTER — Ambulatory Visit (HOSPITAL_COMMUNITY)
Admission: RE | Admit: 2024-06-29 | Discharge: 2024-06-29 | Disposition: A | Discharge status: Home / Self Care | Source: Ambulatory Visit

## 2024-06-29 ENCOUNTER — Other Ambulatory Visit (HOSPITAL_COMMUNITY)

## 2024-06-29 DIAGNOSIS — Z1231 Encounter for screening mammogram for malignant neoplasm of breast: Secondary | ICD-10-CM | POA: Diagnosis present

## 2024-07-08 ENCOUNTER — Ambulatory Visit (HOSPITAL_BASED_OUTPATIENT_CLINIC_OR_DEPARTMENT_OTHER)

## 2024-07-13 ENCOUNTER — Ambulatory Visit (INDEPENDENT_AMBULATORY_CARE_PROVIDER_SITE_OTHER)

## 2024-07-13 ENCOUNTER — Encounter (HOSPITAL_BASED_OUTPATIENT_CLINIC_OR_DEPARTMENT_OTHER): Payer: Self-pay

## 2024-07-13 VITALS — BP 127/82 | HR 102 | Ht 66.0 in | Wt 301.0 lb

## 2024-07-13 DIAGNOSIS — G4733 Obstructive sleep apnea (adult) (pediatric): Secondary | ICD-10-CM

## 2024-07-13 NOTE — Patient Instructions (Signed)
 Continue CPAP usage nightly; goal of 4-6 hours or more.  Complete sleep study.  Follow up in 31-90 days for compliance download and to review sleep study.

## 2024-07-13 NOTE — Progress Notes (Signed)
 @Patient  ID: Andrea Watts, female    DOB: [birth year], 53 y.o.   MRN: [id number]  Chief Complaint  Patient presents with   Establish Care    Referring provider: Bucio, Elsa C, FNP  HPI: Discussed the use of AI scribe software for clinical note transcription with the patient, who gave verbal consent to proceed.  History of Present Illness Andrea Watts is a 53 year old female with sleep apnea who presents for evaluation of sleep concerns and CPAP use. She was referred by her primary care doctor for evaluation of sleep concerns.  She has longstanding sleep issues, including difficulty falling asleep and an irregular sleep schedule. Typically, she goes to bed between midnight and 2 AM, but her sleep is often disrupted. She has been sleeping in a recliner due to back problems, which she believes affects her sleep quality. During a recent trip, she had to sleep in a bed and experienced significant sleep disturbances, including episodes where her friends had to wake her up to breathe.  A home sleep study indicated an average of ~15 events per hour. She recently started using a CPAP machine but has only used it for two nights due to discomfort and dryness, despite using distilled water in the humidifier. She experiences significant thirst when using the CPAP, which disrupts her sleep.  She has experienced weight fluctuations over the past few years. Initially, she lost 30 pounds on Mounjaro, but after insurance stopped covering it, she regained the weight. She tried Ozempic and Wegovy but experienced adverse effects and no significant weight loss. She is concerned about her weight contributing to her sleep apnea and other health issues.  She is currently disabled and not working, which affects her daily routine and sleep schedule. 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  Her past  medical history includes a recent increase in her A1c to 6.3-6.4, and she is concerned about the risk of developing diabetes. She is motivated to manage her weight and sleep apnea to prevent further health complications.   TEST/EVENTS :  11/04/2023:  AHI 14.6/hr (4%)  AHI 3% 34.7/hr; O2 sat nadir 79%  Allergies  Allergen Reactions   Amoxicillin  Other (See Comments)    Yeast infection    Immunization History  Administered Date(s) Administered   Influenza,inj,Quad PF,6+ Mos 09/27/2020, 04/25/2021   Influenza-Unspecified 08/03/2020, 08/23/2022    Past Medical History:  Diagnosis Date   ADD (attention deficit disorder)    Anxiety    Arthritis    Phreesia 09/13/2020   Depression    Depression    Phreesia 09/13/2020   Hypertension    Pre-diabetes    Skin ulcer of female breast (HCC) 07/27/2017    Tobacco History: Social History   Tobacco Use  Smoking Status Every Day   Current packs/day: 0.50   Types: Cigarettes  Smokeless Tobacco Never   Ready to quit: Not Answered Counseling given: Not Answered   Outpatient Medications Prior to Visit  Medication Sig Dispense Refill   ALPRAZolam  (XANAX ) 1 MG tablet Take 0.5-1 mg by mouth See admin instructions. Take 1 mg in the morning and 0.5 mg in the evening, may take a 1 mg dose during the day as needed     amphetamine -dextroamphetamine  (ADDERALL) 30 MG tablet Take 30 mg by mouth 2 (two) times daily.     buprenorphine (SUBUTEX) 8 MG SUBL SL  tablet Place 4-8 mg under the tongue See admin instructions. Take 8 mg in the morning, 4 mg in the afternoon, and 8 mg at night     Cholecalciferol (VITAMIN D3 MAXIMUM STRENGTH) 125 MCG (5000 UT) capsule Take 5,000 Units by mouth daily.     lisinopril (ZESTRIL) 5 MG tablet Take 5 mg by mouth daily.     Magnesium 500 MG CAPS Take 500 mg by mouth daily as needed (cramping).     Omega-3 Fatty Acids (FISH OIL) 1200 MG CAPS Take 1,200 mg by mouth daily.     omeprazole (PRILOSEC) 20 MG capsule Take 20  mg by mouth daily.     SODIUM FLUORIDE 5000 PLUS 1.1 % CREA dental cream Place 1 Application onto teeth daily.     Bioflavonoid Products (GRAPE SEED EXTRACT) CAPS Take 1 capsule by mouth daily.     Cyanocobalamin (B-12) 5000 MCG CAPS Take 5,000 mcg by mouth daily.     escitalopram (LEXAPRO) 20 MG tablet Take 20 mg by mouth daily.     methocarbamol  (ROBAXIN ) 500 MG tablet Take 1 tablet (500 mg total) by mouth every 6 (six) hours as needed for muscle spasms. 20 tablet 0   naproxen  (NAPROSYN ) 500 MG tablet Take 1 tablet (500 mg total) by mouth 2 (two) times daily. 10 tablet 0   No facility-administered medications prior to visit.     Review of Systems: as per hpi  Constitutional:   No  weight loss, night sweats,  Fevers, chills, fatigue, or  lassitude.  HEENT:   No headaches,  Difficulty swallowing,  Tooth/dental problems, or  Sore throat,                No sneezing, itching, ear ache, nasal congestion, post nasal drip,   CV:  No chest pain,  Orthopnea, PND, swelling in lower extremities, anasarca, dizziness, palpitations, syncope.   GI  No heartburn, indigestion, abdominal pain, nausea, vomiting, diarrhea, change in bowel habits, loss of appetite, bloody stools.   Resp: No shortness of breath with exertion or at rest.  No excess mucus, no productive cough,  No non-productive cough,  No coughing up of blood.  No change in color of mucus.  No wheezing.  No chest wall deformity  Skin: no rash or lesions.  GU: no dysuria, change in color of urine, no urgency or frequency.  No flank pain, no hematuria   MS:  No joint pain or swelling.  No decreased range of motion.  No back pain.    Physical Exam  BP 127/82   Pulse (!) 102   Ht 5' 6 (1.676 m)   Wt (!) 301 lb (136.5 kg)   LMP 07/07/2017   SpO2 96%   BMI 48.58 kg/m   GEN: A/Ox3; pleasant , NAD, well nourished    HEENT:  [place]/AT,  EACs-clear, TMs-wnl, NOSE-clear, THROAT-clear, no lesions, no postnasal drip or exudate noted.  Mallampati 3  NECK:  Supple w/ fair ROM; no JVD; normal carotid impulses w/o bruits; no thyromegaly or nodules palpated; no lymphadenopathy.    RESP  Clear  P & A; w/o, wheezes/ rales/ or rhonchi. no accessory muscle use, no dullness to percussion  CARD:  RRR, no m/r/g, no peripheral edema, pulses intact, no cyanosis or clubbing.  GI:   obese, soft & nt; nml bowel sounds; no organomegaly or masses detected.   Musco: Warm bil, no deformities or joint swelling noted.   Neuro: alert, no focal deficits noted.  Skin: Warm, no lesions or rashes    Lab Results:  CBC    Component Value Date/Time   WBC 9.9 01/16/2024 1315   RBC 4.92 01/16/2024 1315   HGB 14.6 01/16/2024 1315   HGB 14.6 02/24/2021 1339   HCT 44.1 01/16/2024 1315   HCT 42.7 02/24/2021 1339   PLT 321 01/16/2024 1315   PLT 332 02/24/2021 1339   MCV 89.6 01/16/2024 1315   MCV 88 02/24/2021 1339   MCH 29.7 01/16/2024 1315   MCHC 33.1 01/16/2024 1315   RDW 13.5 01/16/2024 1315   RDW 12.9 02/24/2021 1339   LYMPHSABS 4.6 (H) 01/16/2024 1315   MONOABS 0.8 01/16/2024 1315   EOSABS 0.3 01/16/2024 1315   BASOSABS 0.1 01/16/2024 1315    BMET    Component Value Date/Time   NA 137 01/16/2024 1315   NA 136 02/24/2021 1339   K 3.8 01/16/2024 1315   CL 102 01/16/2024 1315   CO2 24 01/16/2024 1315   GLUCOSE 86 01/16/2024 1315   BUN 11 01/16/2024 1315   BUN 12 02/24/2021 1339   CREATININE 0.55 01/16/2024 1315   CALCIUM 9.5 01/16/2024 1315   GFRNONAA >60 01/16/2024 1315   GFRAA >60 07/26/2017 0551    BNP No results found for: BNP  ProBNP No results found for: PROBNP  Imaging: MM 3D SCREENING MAMMOGRAM BILATERAL BREAST Result Date: 07/01/2024 CLINICAL DATA:  Screening. EXAM: DIGITAL SCREENING BILATERAL MAMMOGRAM WITH TOMOSYNTHESIS AND CAD TECHNIQUE: Bilateral screening digital craniocaudal and mediolateral oblique mammograms were obtained. Bilateral screening digital breast tomosynthesis was performed. The  images were evaluated with computer-aided detection. COMPARISON:  Previous exam(s). ACR Breast Density Category a: The breasts are almost entirely fatty. FINDINGS: There are no findings suspicious for malignancy. IMPRESSION: No mammographic evidence of malignancy. A result letter of this screening mammogram will be mailed directly to the patient. RECOMMENDATION: Screening mammogram in one year. (Code:SM-B-01Y) BI-RADS CATEGORY  1: Negative. Electronically Signed   By: Dina  Arceo M.D.   On: 07/01/2024 11:46    Administration History     None           No data to display          No results found for: NITRICOXIDE   Assessment & Plan:   Assessment & Plan OSA (obstructive sleep apnea)  Assessment and Plan Assessment & Plan Obstructive sleep apnea Confirmed by home sleep study with 14.6 events/hour. CPAP therapy initiated but limited use due to dryness and discomfort. Discussed CPAP's role in reducing cardiovascular risks and overcoming airway obstruction. Patient expresses concern that her sleep study completed in March did not demonstrate the full severity of her sleep apnea.  Explained potential for in-lab study but home tests are accurate. She desires reassessment to open the possibility of exploring treatment options like Zepbound. - Adjust humidity settings on CPAP machine to alleviate dryness. - Referred to [facility] for mask fitting and desensitization. - Ordered home sleep study to reassess severity of sleep apnea per patient request. - Scheduled follow-up in 4-6 weeks to review compliance download and assess CPAP efficacy.  Obesity Weight gain after discontinuing weight loss medications due to insurance issues. Previous medications included Mounjaro, Ozempic, and [id number]. Current weight gain affects mobility and health, impacting sleep apnea and cardiovascular risks. Consider Zepbound based on repeat HST results  Insomnia Chronic difficulty falling asleep with variable  schedule. Discussed sleep hygiene and impact of sleep apnea on sleep quality. - Encouraged consistent sleep schedule and good sleep hygiene practices.  Return in about 7 weeks (around 08/31/2024) for sleep study review; compliance download.  Candis Dandy, PA-C 07/13/2024

## 2024-07-13 NOTE — Progress Notes (Signed)
 Epworth Sleepiness Scale  Use the following scale to choose the most appropriate number for each situation. 0 Would never nod off 1  Slight  chance of nodding off 2 Moderate chance of nodding off 3 High chance of nodding off  Sitting and reading: 3 Watching TV: 3 Sitting, inactive, in a public place (e.g., in a meeting, theater, or dinner event): 1 As a passenger in a car for an hour or more without stopping for a break: 1 Lying down to rest when circumstances permit:2 Sitting and talking to someone: 0 Sitting quietly after a meal without alcohol: 0 In a car, while stopped for a few  minutes in traffic or at a light: 0  TOTOAL: 10

## 2024-08-02 ENCOUNTER — Encounter

## 2024-08-02 DIAGNOSIS — G4733 Obstructive sleep apnea (adult) (pediatric): Secondary | ICD-10-CM

## 2024-08-17 DIAGNOSIS — G4733 Obstructive sleep apnea (adult) (pediatric): Secondary | ICD-10-CM | POA: Diagnosis not present

## 2024-08-17 DIAGNOSIS — R069 Unspecified abnormalities of breathing: Secondary | ICD-10-CM | POA: Diagnosis not present

## 2024-08-28 LAB — GENECONNECT MOLECULAR SCREEN: Genetic Analysis Overall Interpretation: NEGATIVE

## 2024-09-08 ENCOUNTER — Ambulatory Visit (HOSPITAL_BASED_OUTPATIENT_CLINIC_OR_DEPARTMENT_OTHER)

## 2024-09-08 NOTE — Progress Notes (Unsigned)
 "  @Patient  ID: Andrea Watts, female    DOB: 1971-08-03, 54 y.o.   MRN: [id number]  No chief complaint on file.   Referring provider: Bucio, Elsa C, FNP  HPI: Discussed the use of AI scribe software for clinical note transcription with the patient, who gave verbal consent to proceed.  History of Present Illness     Andrea Watts is a 54 year old female with sleep apnea who presents for evaluation of sleep concerns and CPAP use. She was referred by her primary care doctor for evaluation of sleep concerns.   She has longstanding sleep issues, including difficulty falling asleep and an irregular sleep schedule. Typically, she goes to bed between midnight and 2 AM, but her sleep is often disrupted. She has been sleeping in a recliner due to back problems, which she believes affects her sleep quality. During a recent trip, she had to sleep in a bed and experienced significant sleep disturbances, including episodes where her friends had to wake her up to breathe.   A home sleep study indicated an average of ~15 events per hour. She recently started using a CPAP machine but has only used it for two nights due to discomfort and dryness, despite using distilled water in the humidifier. She experiences significant thirst when using the CPAP, which disrupts her sleep.   She has experienced weight fluctuations over the past few years. Initially, she lost 30 pounds on Mounjaro, but after insurance stopped covering it, she regained the weight. She tried Ozempic and Wegovy but experienced adverse effects and no significant weight loss. She is concerned about her weight contributing to her sleep apnea and other health issues.   She is currently disabled and not working, which affects her daily routine and sleep schedule. 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   Her past medical history  includes a recent increase in her A1c to 6.3-6.4, and she is concerned about the risk of developing diabetes. She is motivated to manage her weight and sleep apnea to prevent further health complications.     TEST/EVENTS :  11/04/2023:  AHI 14.6/hr (4%)  AHI 3% 34.7/hr; O2 sat nadir 79%  HST 08/17/2024:  Severe OSA with AHI 4% at 47.5/hr O2 sat nadir 73%  Allergies[1]  Immunization History  Administered Date(s) Administered   Influenza,inj,Quad PF,6+ Mos 09/27/2020, 04/25/2021   Influenza-Unspecified 08/03/2020, 08/23/2022    Past Medical History:  Diagnosis Date   ADD (attention deficit disorder)    Anxiety    Arthritis    [employer]reesia 09/13/2020   Depression    Depression    [employer] 09/13/2020   Hypertension    Pre-diabetes    Skin ulcer of female breast (HCC) 07/27/2017    Tobacco History: Tobacco Use History[2] Ready to quit: Not Answered Counseling given: Not Answered   Outpatient Medications Prior to Visit  Medication Sig Dispense Refill   ALPRAZolam  (XANAX ) 1 MG tablet Take 0.5-1 mg by mouth See admin instructions. Take 1 mg in the morning and 0.5 mg in the evening, may take a 1 mg dose during the day as needed     amphetamine -dextroamphetamine  (ADDERALL) 30 MG tablet Take 30 mg by mouth 2 (two) times daily.     buprenorphine (SUBUTEX) 8 MG SUBL SL tablet Place 4-8 mg under the tongue See admin instructions. Take 8 mg in the morning, 4 mg  in the afternoon, and 8 mg at night     Cholecalciferol (VITAMIN D3 MAXIMUM STRENGTH) 125 MCG (5000 UT) capsule Take 5,000 Units by mouth daily.     lisinopril (ZESTRIL) 5 MG tablet Take 5 mg by mouth daily.     Magnesium 500 MG CAPS Take 500 mg by mouth daily as needed (cramping).     Omega-3 Fatty Acids (FISH OIL) 1200 MG CAPS Take 1,200 mg by mouth daily.     omeprazole (PRILOSEC) 20 MG capsule Take 20 mg by mouth daily.     SODIUM FLUORIDE 5000 PLUS 1.1 % CREA dental cream Place 1 Application onto teeth daily.     No  facility-administered medications prior to visit.     Review of Systems:   Constitutional:   No  weight loss, night sweats,  Fevers, chills, fatigue, or  lassitude.  HEENT:   No headaches,  Difficulty swallowing,  Tooth/dental problems, or  Sore throat,                No sneezing, itching, ear ache, nasal congestion, post nasal drip,   CV:  No chest pain,  Orthopnea, PND, swelling in lower extremities, anasarca, dizziness, palpitations, syncope.   GI  No heartburn, indigestion, abdominal pain, nausea, vomiting, diarrhea, change in bowel habits, loss of appetite, bloody stools.   Resp: No shortness of breath with exertion or at rest.  No excess mucus, no productive cough,  No non-productive cough,  No coughing up of blood.  No change in color of mucus.  No wheezing.  No chest wall deformity  Skin: no rash or lesions.  GU: no dysuria, change in color of urine, no urgency or frequency.  No flank pain, no hematuria   MS:  No joint pain or swelling.  No decreased range of motion.  No back pain.    Physical Exam  LMP 07/07/2017   GEN: A/Ox3; pleasant , NAD, well nourished    HEENT:  [place]/AT,  EACs-clear, TMs-wnl, NOSE-clear, THROAT-clear, no lesions, no postnasal drip or exudate noted.   NECK:  Supple w/ fair ROM; no JVD; normal carotid impulses w/o bruits; no thyromegaly or nodules palpated; no lymphadenopathy.    RESP  Clear  P & A; w/o, wheezes/ rales/ or rhonchi. no accessory muscle use, no dullness to percussion  CARD:  RRR, no m/r/g, no peripheral edema, pulses intact, no cyanosis or clubbing.  GI:   Soft & nt; nml bowel sounds; no organomegaly or masses detected.   Musco: Warm bil, no deformities or joint swelling noted.   Neuro: alert, no focal deficits noted.    Skin: Warm, no lesions or rashes    Lab Results:  CBC    Component Value Date/Time   WBC 9.9 01/16/2024 1315   RBC 4.92 01/16/2024 1315   HGB 14.6 01/16/2024 1315   HGB 14.6 02/24/2021 1339   HCT  44.1 01/16/2024 1315   HCT 42.7 02/24/2021 1339   PLT 321 01/16/2024 1315   PLT 332 02/24/2021 1339   MCV 89.6 01/16/2024 1315   MCV 88 02/24/2021 1339   MCH 29.7 01/16/2024 1315   MCHC 33.1 01/16/2024 1315   RDW 13.5 01/16/2024 1315   RDW 12.9 02/24/2021 1339   LYMPHSABS 4.6 (H) 01/16/2024 1315   MONOABS 0.8 01/16/2024 1315   EOSABS 0.3 01/16/2024 1315   BASOSABS 0.1 01/16/2024 1315    BMET    Component Value Date/Time   NA 137 01/16/2024 1315   NA 136 02/24/2021 1339  K 3.8 01/16/2024 1315   CL 102 01/16/2024 1315   CO2 24 01/16/2024 1315   GLUCOSE 86 01/16/2024 1315   BUN 11 01/16/2024 1315   BUN 12 02/24/2021 1339   CREATININE 0.55 01/16/2024 1315   CALCIUM 9.5 01/16/2024 1315   GFRNONAA >60 01/16/2024 1315   GFRAA >60 07/26/2017 0551    BNP No results found for: BNP  ProBNP No results found for: PROBNP  Imaging: No results found.  Administration History     None           No data to display          No results found for: NITRICOXIDE   Assessment & Plan:   Assessment & Plan    No follow-ups on file.  Candis Dandy, PA-C 09/08/2024      [1]  Allergies Allergen Reactions   Amoxicillin  Other (See Comments)    Yeast infection  [2]  Social History Tobacco Use  Smoking Status Every Day   Current packs/day: 0.50   Types: Cigarettes  Smokeless Tobacco Never   "

## 2024-09-15 ENCOUNTER — Ambulatory Visit (HOSPITAL_BASED_OUTPATIENT_CLINIC_OR_DEPARTMENT_OTHER): Payer: Self-pay
